# Patient Record
Sex: Female | Born: 1935 | Race: White | Hispanic: No | Marital: Single | Smoking: Never smoker
Health system: Southern US, Community
[De-identification: ages and names within clinical notes are randomized; demographics above are authoritative.]

## PROBLEM LIST (undated history)

## (undated) DIAGNOSIS — D72829 Elevated white blood cell count, unspecified: Secondary | ICD-10-CM

## (undated) DIAGNOSIS — M545 Other chronic pain: Secondary | ICD-10-CM

## (undated) DIAGNOSIS — Z86010 Personal history of colonic polyps: Secondary | ICD-10-CM

## (undated) DIAGNOSIS — G4700 Insomnia, unspecified: Principal | ICD-10-CM

## (undated) DIAGNOSIS — IMO0002 Reserved for concepts with insufficient information to code with codable children: Secondary | ICD-10-CM

## (undated) DIAGNOSIS — Z860101 Personal history of adenomatous and serrated colon polyps: Secondary | ICD-10-CM

## (undated) DIAGNOSIS — C4490 Unspecified malignant neoplasm of skin, unspecified: Secondary | ICD-10-CM

## (undated) DIAGNOSIS — G3184 Mild cognitive impairment, so stated: Secondary | ICD-10-CM

## (undated) DIAGNOSIS — M51369 Other intervertebral disc degeneration, lumbar region without mention of lumbar back pain or lower extremity pain: Secondary | ICD-10-CM

## (undated) DIAGNOSIS — M5136 Other intervertebral disc degeneration, lumbar region: Secondary | ICD-10-CM

## (undated) DIAGNOSIS — Z9289 Personal history of other medical treatment: Secondary | ICD-10-CM

## (undated) DIAGNOSIS — R011 Cardiac murmur, unspecified: Secondary | ICD-10-CM

## (undated) DIAGNOSIS — C9110 Chronic lymphocytic leukemia of B-cell type not having achieved remission: Secondary | ICD-10-CM

## (undated) DIAGNOSIS — E785 Hyperlipidemia, unspecified: Secondary | ICD-10-CM

## (undated) DIAGNOSIS — G8929 Other chronic pain: Secondary | ICD-10-CM

## (undated) HISTORY — DX: Personal history of other medical treatment: Z92.89

## (undated) HISTORY — PX: CATARACT EXTRACTION: SUR2

## (undated) HISTORY — DX: Cardiac murmur, unspecified: R01.1

## (undated) HISTORY — DX: Reserved for concepts with insufficient information to code with codable children: IMO0002

## (undated) HISTORY — DX: Personal history of colonic polyps: Z86.010

## (undated) HISTORY — DX: Other chronic pain: M54.50

## (undated) HISTORY — PX: BREAST ENHANCEMENT SURGERY: SHX7

## (undated) HISTORY — DX: Other chronic pain: G89.29

## (undated) HISTORY — DX: Personal history of adenomatous and serrated colon polyps: Z86.0101

## (undated) HISTORY — DX: Unspecified malignant neoplasm of skin, unspecified: C44.90

## (undated) HISTORY — DX: Chronic lymphocytic leukemia of B-cell type not having achieved remission: C91.10

## (undated) HISTORY — DX: Mild cognitive impairment, so stated: G31.84

## (undated) HISTORY — DX: Elevated white blood cell count, unspecified: D72.829

## (undated) HISTORY — DX: Other intervertebral disc degeneration, lumbar region without mention of lumbar back pain or lower extremity pain: M51.369

## (undated) HISTORY — DX: Insomnia, unspecified: G47.00

## (undated) HISTORY — DX: Other intervertebral disc degeneration, lumbar region: M51.36

## (undated) HISTORY — DX: Low back pain: M54.5

## (undated) HISTORY — DX: Hyperlipidemia, unspecified: E78.5

---

## 1994-09-16 HISTORY — PX: KNEE ARTHROSCOPY: SUR90

## 1995-09-17 HISTORY — PX: OTHER SURGICAL HISTORY: SHX169

## 2004-01-11 ENCOUNTER — Other Ambulatory Visit: Admission: RE | Admit: 2004-01-11 | Discharge: 2004-01-11 | Payer: Self-pay | Admitting: Oncology

## 2004-09-04 ENCOUNTER — Ambulatory Visit: Payer: Self-pay | Admitting: Oncology

## 2005-10-15 ENCOUNTER — Ambulatory Visit: Payer: Self-pay | Admitting: Oncology

## 2008-08-16 ENCOUNTER — Ambulatory Visit: Payer: Self-pay | Admitting: Oncology

## 2008-08-18 LAB — CBC WITH DIFFERENTIAL/PLATELET
EOS%: 0.9 % (ref 0.0–7.0)
MCH: 32.2 pg (ref 26.0–34.0)
MCV: 94 fL (ref 81.0–101.0)
MONO%: 4.1 % (ref 0.0–13.0)
NEUT#: 3.4 10*3/uL (ref 1.5–6.5)
RBC: 4 10*6/uL (ref 3.70–5.32)
RDW: 14.5 % (ref 11.3–14.5)
lymph#: 12.1 10*3/uL — ABNORMAL HIGH (ref 0.9–3.3)

## 2008-08-18 LAB — COMPREHENSIVE METABOLIC PANEL
AST: 18 U/L (ref 0–37)
Albumin: 4.2 g/dL (ref 3.5–5.2)
Alkaline Phosphatase: 71 U/L (ref 39–117)
BUN: 16 mg/dL (ref 6–23)
Potassium: 4.4 mEq/L (ref 3.5–5.3)
Sodium: 135 mEq/L (ref 135–145)
Total Bilirubin: 0.6 mg/dL (ref 0.3–1.2)

## 2008-12-01 ENCOUNTER — Encounter: Payer: Self-pay | Admitting: Family Medicine

## 2009-01-19 ENCOUNTER — Emergency Department (HOSPITAL_BASED_OUTPATIENT_CLINIC_OR_DEPARTMENT_OTHER): Admission: EM | Admit: 2009-01-19 | Discharge: 2009-01-19 | Payer: Self-pay | Admitting: Emergency Medicine

## 2009-01-19 ENCOUNTER — Ambulatory Visit: Payer: Self-pay | Admitting: Radiology

## 2009-02-09 ENCOUNTER — Ambulatory Visit: Payer: Self-pay | Admitting: Family Medicine

## 2009-02-09 DIAGNOSIS — R030 Elevated blood-pressure reading, without diagnosis of hypertension: Secondary | ICD-10-CM

## 2009-02-09 DIAGNOSIS — M810 Age-related osteoporosis without current pathological fracture: Secondary | ICD-10-CM

## 2009-02-09 DIAGNOSIS — C9110 Chronic lymphocytic leukemia of B-cell type not having achieved remission: Secondary | ICD-10-CM

## 2009-02-09 DIAGNOSIS — M5137 Other intervertebral disc degeneration, lumbosacral region: Secondary | ICD-10-CM

## 2009-02-27 DIAGNOSIS — E559 Vitamin D deficiency, unspecified: Secondary | ICD-10-CM

## 2009-02-27 DIAGNOSIS — H409 Unspecified glaucoma: Secondary | ICD-10-CM | POA: Insufficient documentation

## 2009-02-27 DIAGNOSIS — M4120 Other idiopathic scoliosis, site unspecified: Secondary | ICD-10-CM | POA: Insufficient documentation

## 2009-03-01 ENCOUNTER — Ambulatory Visit: Payer: Self-pay | Admitting: Diagnostic Radiology

## 2009-03-01 ENCOUNTER — Ambulatory Visit (HOSPITAL_BASED_OUTPATIENT_CLINIC_OR_DEPARTMENT_OTHER)
Admission: RE | Admit: 2009-03-01 | Discharge: 2009-03-01 | Payer: Self-pay | Admitting: Physical Medicine and Rehabilitation

## 2009-03-01 ENCOUNTER — Ambulatory Visit: Payer: Self-pay | Admitting: Physical Medicine and Rehabilitation

## 2009-03-03 ENCOUNTER — Ambulatory Visit: Payer: Self-pay | Admitting: Oncology

## 2009-03-22 ENCOUNTER — Ambulatory Visit: Payer: Self-pay | Admitting: Physical Medicine and Rehabilitation

## 2009-03-22 LAB — CBC WITH DIFFERENTIAL/PLATELET
Basophils Absolute: 0.1 10*3/uL (ref 0.0–0.1)
EOS%: 0.8 % (ref 0.0–7.0)
HCT: 37 % (ref 34.8–46.6)
HGB: 12.5 g/dL (ref 11.6–15.9)
MCH: 31.3 pg (ref 25.1–34.0)
MONO#: 0.7 10*3/uL (ref 0.1–0.9)
NEUT#: 3.8 10*3/uL (ref 1.5–6.5)
NEUT%: 25.1 % — ABNORMAL LOW (ref 38.4–76.8)
RDW: 14.1 % (ref 11.2–14.5)
WBC: 15.1 10*3/uL — ABNORMAL HIGH (ref 3.9–10.3)
lymph#: 10.4 10*3/uL — ABNORMAL HIGH (ref 0.9–3.3)

## 2009-03-22 LAB — COMPREHENSIVE METABOLIC PANEL
AST: 22 U/L (ref 0–37)
Albumin: 3.9 g/dL (ref 3.5–5.2)
Alkaline Phosphatase: 47 U/L (ref 39–117)
BUN: 18 mg/dL (ref 6–23)
Calcium: 8.8 mg/dL (ref 8.4–10.5)
Chloride: 103 mEq/L (ref 96–112)
Glucose, Bld: 134 mg/dL — ABNORMAL HIGH (ref 70–99)
Potassium: 4.3 mEq/L (ref 3.5–5.3)
Sodium: 137 mEq/L (ref 135–145)
Total Protein: 6.1 g/dL (ref 6.0–8.3)

## 2009-05-03 ENCOUNTER — Ambulatory Visit: Payer: Self-pay | Admitting: Physical Medicine and Rehabilitation

## 2009-05-24 ENCOUNTER — Encounter
Admission: RE | Admit: 2009-05-24 | Discharge: 2009-08-22 | Payer: Self-pay | Admitting: Physical Medicine and Rehabilitation

## 2009-06-24 ENCOUNTER — Ambulatory Visit: Payer: Self-pay | Admitting: Diagnostic Radiology

## 2009-06-24 ENCOUNTER — Emergency Department (HOSPITAL_BASED_OUTPATIENT_CLINIC_OR_DEPARTMENT_OTHER): Admission: EM | Admit: 2009-06-24 | Discharge: 2009-06-24 | Payer: Self-pay | Admitting: Emergency Medicine

## 2009-07-05 ENCOUNTER — Ambulatory Visit: Payer: Self-pay | Admitting: Physical Medicine and Rehabilitation

## 2009-09-04 ENCOUNTER — Ambulatory Visit: Payer: Self-pay | Admitting: Oncology

## 2009-09-06 LAB — CBC WITH DIFFERENTIAL/PLATELET
EOS%: 0.2 % (ref 0.0–7.0)
MCH: 31.7 pg (ref 25.1–34.0)
MCV: 94.2 fL (ref 79.5–101.0)
MONO%: 3.8 % (ref 0.0–14.0)
RBC: 4.25 10*6/uL (ref 3.70–5.45)
RDW: 14.6 % — ABNORMAL HIGH (ref 11.2–14.5)

## 2009-09-06 LAB — COMPREHENSIVE METABOLIC PANEL
AST: 28 U/L (ref 0–37)
Albumin: 4.3 g/dL (ref 3.5–5.2)
Alkaline Phosphatase: 44 U/L (ref 39–117)
BUN: 15 mg/dL (ref 6–23)
Potassium: 4.2 mEq/L (ref 3.5–5.3)
Sodium: 137 mEq/L (ref 135–145)
Total Protein: 6.8 g/dL (ref 6.0–8.3)

## 2009-09-06 LAB — LACTATE DEHYDROGENASE: LDH: 148 U/L (ref 94–250)

## 2010-02-01 ENCOUNTER — Encounter: Admission: RE | Admit: 2010-02-01 | Discharge: 2010-02-01 | Payer: Self-pay | Admitting: Internal Medicine

## 2010-03-06 ENCOUNTER — Ambulatory Visit: Payer: Self-pay | Admitting: Oncology

## 2010-03-08 LAB — COMPREHENSIVE METABOLIC PANEL
Albumin: 4.2 g/dL (ref 3.5–5.2)
Alkaline Phosphatase: 43 U/L (ref 39–117)
BUN: 23 mg/dL (ref 6–23)
CO2: 28 mEq/L (ref 19–32)
Glucose, Bld: 80 mg/dL (ref 70–99)
Potassium: 4.4 mEq/L (ref 3.5–5.3)

## 2010-03-08 LAB — CBC WITH DIFFERENTIAL/PLATELET
Basophils Absolute: 0.1 10*3/uL (ref 0.0–0.1)
EOS%: 0.8 % (ref 0.0–7.0)
HCT: 39.3 % (ref 34.8–46.6)
HGB: 13.4 g/dL (ref 11.6–15.9)
MCH: 32.1 pg (ref 25.1–34.0)
MCV: 94.4 fL (ref 79.5–101.0)
MONO%: 4.2 % (ref 0.0–14.0)
NEUT%: 18.3 % — ABNORMAL LOW (ref 38.4–76.8)
lymph#: 11.2 10*3/uL — ABNORMAL HIGH (ref 0.9–3.3)

## 2010-09-05 ENCOUNTER — Ambulatory Visit: Payer: Self-pay | Admitting: Oncology

## 2010-09-06 LAB — COMPREHENSIVE METABOLIC PANEL
Albumin: 4.3 g/dL (ref 3.5–5.2)
BUN: 23 mg/dL (ref 6–23)
CO2: 28 mEq/L (ref 19–32)
Calcium: 8.9 mg/dL (ref 8.4–10.5)
Chloride: 102 mEq/L (ref 96–112)
Glucose, Bld: 97 mg/dL (ref 70–99)
Potassium: 4.5 mEq/L (ref 3.5–5.3)
Sodium: 135 mEq/L (ref 135–145)
Total Protein: 6.6 g/dL (ref 6.0–8.3)

## 2010-09-06 LAB — CBC WITH DIFFERENTIAL/PLATELET
Basophils Absolute: 0.1 10*3/uL (ref 0.0–0.1)
Eosinophils Absolute: 0.1 10*3/uL (ref 0.0–0.5)
HGB: 12.7 g/dL (ref 11.6–15.9)
MCV: 93.6 fL (ref 79.5–101.0)
MONO#: 0.6 10*3/uL (ref 0.1–0.9)
MONO%: 3.4 % (ref 0.0–14.0)
NEUT#: 4.4 10*3/uL (ref 1.5–6.5)
RDW: 14.4 % (ref 11.2–14.5)
WBC: 16.5 10*3/uL — ABNORMAL HIGH (ref 3.9–10.3)
lymph#: 11.4 10*3/uL — ABNORMAL HIGH (ref 0.9–3.3)

## 2010-10-07 ENCOUNTER — Encounter: Payer: Self-pay | Admitting: Obstetrics and Gynecology

## 2010-12-20 LAB — CBC
HCT: 42.7 % (ref 36.0–46.0)
Hemoglobin: 14.3 g/dL (ref 12.0–15.0)
MCV: 92.2 fL (ref 78.0–100.0)
Platelets: 279 10*3/uL (ref 150–400)
RDW: 13.8 % (ref 11.5–15.5)
WBC: 16.3 10*3/uL — ABNORMAL HIGH (ref 4.0–10.5)

## 2010-12-20 LAB — DIFFERENTIAL
Basophils Absolute: 0.4 10*3/uL — ABNORMAL HIGH (ref 0.0–0.1)
Basophils Relative: 2 % — ABNORMAL HIGH (ref 0–1)
Lymphocytes Relative: 65 % — ABNORMAL HIGH (ref 12–46)
Monocytes Absolute: 0.4 10*3/uL (ref 0.1–1.0)
Neutro Abs: 5 10*3/uL (ref 1.7–7.7)
Neutrophils Relative %: 30 % — ABNORMAL LOW (ref 43–77)

## 2010-12-20 LAB — COMPREHENSIVE METABOLIC PANEL
Albumin: 4.6 g/dL (ref 3.5–5.2)
Alkaline Phosphatase: 68 U/L (ref 39–117)
BUN: 13 mg/dL (ref 6–23)
Chloride: 102 mEq/L (ref 96–112)
Creatinine, Ser: 0.7 mg/dL (ref 0.4–1.2)
Glucose, Bld: 132 mg/dL — ABNORMAL HIGH (ref 70–99)
Potassium: 4.1 mEq/L (ref 3.5–5.1)
Total Bilirubin: 1.1 mg/dL (ref 0.3–1.2)

## 2010-12-20 LAB — URINALYSIS, ROUTINE W REFLEX MICROSCOPIC
Ketones, ur: 40 mg/dL — AB
Protein, ur: NEGATIVE mg/dL
Urobilinogen, UA: 1 mg/dL (ref 0.0–1.0)

## 2010-12-20 LAB — URINE CULTURE: Colony Count: 80000

## 2010-12-20 LAB — URINE MICROSCOPIC-ADD ON

## 2010-12-20 LAB — LIPASE, BLOOD: Lipase: 40 U/L (ref 23–300)

## 2010-12-25 LAB — BASIC METABOLIC PANEL
BUN: 18 mg/dL (ref 6–23)
CO2: 26 mEq/L (ref 19–32)
Calcium: 8.4 mg/dL (ref 8.4–10.5)
Chloride: 100 mEq/L (ref 96–112)
Creatinine, Ser: 0.7 mg/dL (ref 0.4–1.2)
Glucose, Bld: 102 mg/dL — ABNORMAL HIGH (ref 70–99)

## 2010-12-25 LAB — DIFFERENTIAL
Basophils Absolute: 0.2 10*3/uL — ABNORMAL HIGH (ref 0.0–0.1)
Eosinophils Absolute: 0.2 10*3/uL (ref 0.0–0.7)
Lymphocytes Relative: 61 % — ABNORMAL HIGH (ref 12–46)
Lymphs Abs: 9.9 10*3/uL — ABNORMAL HIGH (ref 0.7–4.0)
Neutro Abs: 5.3 10*3/uL (ref 1.7–7.7)

## 2010-12-25 LAB — D-DIMER, QUANTITATIVE: D-Dimer, Quant: <0.22 ug/mL-FEU (ref 0.00–0.48)

## 2010-12-25 LAB — CBC
HCT: 37.5 % (ref 36.0–46.0)
MCV: 93.5 fL (ref 78.0–100.0)
Platelets: 226 10*3/uL (ref 150–400)
RDW: 13.6 % (ref 11.5–15.5)

## 2010-12-25 LAB — POCT CARDIAC MARKERS

## 2011-01-01 ENCOUNTER — Ambulatory Visit (INDEPENDENT_AMBULATORY_CARE_PROVIDER_SITE_OTHER): Payer: Medicare Other | Admitting: Internal Medicine

## 2011-01-01 ENCOUNTER — Encounter: Payer: Self-pay | Admitting: Internal Medicine

## 2011-01-01 VITALS — BP 112/64 | HR 68 | Ht 62.0 in | Wt 130.0 lb

## 2011-01-01 DIAGNOSIS — Z800 Family history of malignant neoplasm of digestive organs: Secondary | ICD-10-CM

## 2011-01-01 DIAGNOSIS — K589 Irritable bowel syndrome without diarrhea: Secondary | ICD-10-CM

## 2011-01-01 DIAGNOSIS — K648 Other hemorrhoids: Secondary | ICD-10-CM

## 2011-01-01 MED ORDER — PEG-KCL-NACL-NASULF-NA ASC-C 100 G PO SOLR: 1.0000 | Freq: Once | ORAL | Status: AC

## 2011-01-01 NOTE — Progress Notes (Signed)
Alison Hammond [birth year] MRN [id number]    History of Present Illness:  This is a 77 year old white female who is here to discuss having a colonoscopy. We have received records from [facility] where she has yearly physical exams for the past 5 years. Her last colonoscopy in January 2008 did not show any evidence of polyps. She had prior colonoscopies in [place] and [employer]. She apparently had polyps but we do not have those records. There is a positive family history of colon cancer in patient's mother who died at the age of 62 due to the colon cancer. Her father had esophageal cancer. Patient has chronic lymphocytic leukemia followed by Dr. Arline Asp. She had a normal upper endoscopy in June 2009 . Her only GI complaint is  bloating and gas as well as problems with hemorrhoids. She has osteoporosis and severe degenerative joint disease with thoracolumbar scoliosis. She is interested in a colorectal screening on a regular basis despite her age of 72.   Past Medical History  Diagnosis Date  . Osteoporosis   . Chronic low back pain   . DDD (degenerative disc disease), lumbar   . Chronic lymphocytic leukemia   . Hx of adenomatous colonic polyps     no polyps in 1/08  . Hyperlipidemia   . Glaucoma    Past Surgical History  Procedure Date  . Breast enhancement surgery 1978, 1980    with silicone  . Knee arthroscopy 1996    right  . Repair of crushed femur 1997  . Cataract extraction     right eye    reports that she has never smoked. She has never used smokeless tobacco. She reports that she drinks alcohol. She reports that she does not use illicit drugs. family history includes Colon cancer in her mother; Esophageal cancer in her father; and Fibromyalgia in her daughter. No Known Allergies      Review of Systems: Negative for chest pains, acid symptoms, belching, loss of appetite, nausea or vomiting. Positive for bloating and flatulence and mild hemorrhoids. Denies rectal  bleeding, jaundice or liver problems.  The remainder of the 10  point ROS is negative except as outlined in H&P   Physical Exam: General appearance  Well developed, in no distress. Eyes- non icteric. HEENT nontraumatic, normocephalic. Mouth no lesions, tongue papillated, no cheilosis. Neck supple without adenopathy, thyroid not enlarged, no carotid bruits, no JVD. Lungs Clear to auscultation bilaterally. Cor normal S1 normal S2, regular rhythm , no murmur,  quiet precordium. Abdomen soft, nontender without bruit or tenderness. Liver edge at costal margin. Lower abdomen unremarkable. Rectal: Not done. Extremities: trace  pedal edema, her left leg is shorter by 2 inches due to a prior fracture of the left femoral bone. Skin no lesions. Neurological alert and oriented x 3. Psychological normal mood and affect. Assessment and Plan:  Problems #1 colorectal screening. Patient has a family history of colon cancer and a personal history of colon polyps. Her last colonoscopy was 4 years ago. Patient is interested in proceeding with a colorectal screening. We will schedule her for a colonoscopy. We have discussed the indications and preparation for conscious sedation.  Problem #2 internal hemorrhoids. Patient is currently asymptomatic.  Problem #3 chronic lymphocytic leukemia. This is stable and is followed by Dr Arline Asp.   01/01/2011 Alison Hammond

## 2011-01-01 NOTE — Patient Instructions (Addendum)
You have been scheduled for a colonoscopy. Please follow written instructions given to you at your visit today.  Please pick up your Moviprep kit at the pharmacy within the next 2-3 days. CC: Dr Arline Asp

## 2011-01-03 ENCOUNTER — Encounter: Payer: Self-pay | Admitting: Internal Medicine

## 2011-01-22 ENCOUNTER — Other Ambulatory Visit: Payer: Medicare Other | Admitting: Internal Medicine
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
## 2011-01-29 NOTE — Assessment & Plan Note (Signed)
Alison Hammond is a pleasant and very active 77 year old female, who  I am seeing for the third time.  Her initial visit was on March 01, 2009.  She is referred by Dr. Cathey Endow.  At the last visit on March 22, 2009, her  MRI was reviewed with her and she was initiated on a therapeutic trial  of Neurontin.  She awaited until she came back from her beach vacation  to start the dose.  She has been on Neurontin four times a day, now for  about 2 days.  She states that overall she is better, and assessing her  pain inventory, I note that his general activity initially was  interfered by her pain a lot.  Currently, she indicates that her general  activity is interfered by pain, now very little.   Her pain continues to be in the lateral hip region, more so on the left  than on the right.  Decrease in the low back pain, she had  experience  earlier.  She reports good sleep, pain typically is worse with walking,  bending, and standing.  Improves with rest, heat,  medication, therapy,  and pacing her activities.  She gets fair relief with current  medications at this time.   Functional status, she can walk a mile, she is climbing stairs, and is  able to drive.  She is independent with self-care.  She is engaged in  the therapeutic exercise program as well.  Denies problems with bowel or  bladder.  Denies depression, anxiety, or suicidal ideation.   Review of systems is otherwise negative.   Past medical, social, and family history unchanged from previous visit.   Medications from this clinic include gabapentin 100 mg up to 4 times a  day, typically she takes it 3 times today and may take in an additional  pill at 2 a.m. if she is having some difficulty with sleep at night.  She is also taking tramadol, this in the past, that has been prescribed  by her primary care doctor, and she is requesting that, I filled it  today as well for her.   Exam, blood pressure is 123/78, pulse 78, respirations  18-20, and O2 sat  is 97%.  She is a well-developed, well-nourished elderly female, who  appears younger than her stated age.  She is oriented x3.  Her speech is  clear.  Her affect is bright.  She is alert, cooperative, and pleasant.  Follows commands without difficulty and answers my questions  appropriately.   Cranial nerves are grossly intact.  Coordination is intact.  Reflexes  are 2+ at biceps, triceps, and brachioradialis; 2+ at patellar and  diminished at the Achilles tendon.  No abnormal tone is noted.  No  clonus is noted.  No tremors are noted.  Normal muscle bulk is noted as  well.   Sensory exam is intact to light touch, pinprick in upper and lower  extremities.  Motor strength is 5/5 at hip flexors, knee extensors,  dorsiflexors, and plantar flexors.   She is able to transition easily from sitting to standing without  pushing up with her upper extremities.  Her gait is slightly uneven due  to leg length discrepancy.  She does wear shoe with a platform of one  and three-quarter inch.   Tandem gait and Romberg test are performed adequately.   Lumbar motion is limited in all planes.  She has significant scoliosis,  minimal tenderness with palpation in the paraspinal  musculature, well-  preserved range of motion noted at both hips and knees.   IMPRESSION:  1. History of leg length discrepancy, status post femur fracture in      November 1997 with one and three-quarter inch platform shoe on the      left.  2. History of scoliosis.  3. Osteoporosis.  4. Lumbar spinal stenosis with suggestion of neurogenic claudication.   PLAN:  We will refill gabapentin 100 mg up to 4 times a day, tramadol 50  mg q.i.d. #120 for both with one refill.  I will see her back on  May 26, 2009.  I have answered all her questions today.  She is  tolerating the medication well.  Notes an overall improvement in her  functional status and she is overall pleased with current pain   management.  We will see her back in less than a month.           ______________________________  Brantley Stage, M.D.     DMK/MedQ  D:  05/03/2009 12:32:22  T:  05/04/2009 03:14:39  Job #:  [id number]   cc:   Seymour Bars, D.O.  [facility].  [address]  [place], [place] [address]

## 2011-01-29 NOTE — Assessment & Plan Note (Signed)
[name] is a very active 77 year old female who I am seeing for  the second time.  Her initial visit was on March 01, 2009.   She is also accompanied by her daughter, Alison Hammond.  She was  kindly referred by Dr. Cathey Endow.   Chief complaint was chronic low back pain which she has had for many  years.  She has known scoliosis and a leg length discrepancy which she  acquired after sustaining a severe left femur fracture.   She does wear a one and three-quarter inch heal lift on her left shoe.   Her chief complaint is low back pain which is localized mainly in the  low back but radiates through the posterior bilateral buttocks into the  posterior thigh.  This occurs typically when she is up walking or  standing for more than 1/2 hour at a time.  She is really essentially  pain free when she sits or when she sleeps at night.  Most of her  problem occurs when she has been up standing or walking for any length  of time.   Radiographs were ordered at the last visit and are reviewed here today  with her.  Left hip radiograph showed satisfactory position of the left  hip hardware.  Of note, there was some heterotopic ossification noted.  There is no acute fracture.  Lumbar spine radiographs were also  reviewed, showed old L1 compression fracture, multilevel degenerative  disk disease, rotoscoliotic deformity, convex left grade 1  anterolisthesis of L4 and L5.  MRI of the lumbar spine also done on March 01, 2009, showed old compression fracture at L1 which is completely  healed, lateral recess narrowing at L2-3 and L3-4 without obvious neural  compression, facet arthropathy at L3-4 was noted, severe multifactorial  stenosis at L4-5 secondary to facet arthropathy, ligamentous hypertrophy  anterolisthesis of 5 mm, and bulging of the disk.  Suggestion that nerve  compression may seem likely at this level as well.   The results of these studies were reviewed with her today, and I  answered the questions regarding them.   Her average pain is about 7 or 8 after she has been walking 30+ minutes.  She is able to climb stairs and drive.  She is independent with self-  care.  Overall, high functioning individual, also engaged in exercise  program at her extended living center.   Denies problems controlling bowel or bladder.  Denies depression or  anxiety.  Denies suicidal ideation.  Denies problems with balance.   REVIEW OF SYSTEMS:  Otherwise, negative.   No changes in past medical, social or family history since last visit.   PHYSICAL EXAMINATION:  Blood pressure is 126/77, pulse 72, respirations  20, 99% saturated on room air.  She is a thin adult female who does not  appear in any distress.  She is oriented x3.  Speech is clear.  Affect  is bright.  She is alert, cooperative, and pleasant.  Follows commands  without difficulty.  Answers my questions appropriately.   Cranial nerves are grossly intact.  Coordination is intact.  Reflexes  are 2+ in the biceps, triceps, brachioradialis, 2+ at the patellar and  Achilles tendons.  No abnormal tone is noted.  No clonus is noted.  No  tremors are appreciated.  Sensory exam is intact to light touch in the  lower extremities.  Motor strength 5/5 is noted at hip flexors, knee  extensors, dorsiflexors, plantar flexors, EHL.  The patient transitions easily from sitting to standing.  Gait in the  room is not antalgic.  It is a little uneven.  She does have a 1-1/2-  inch platform shoe on the left.  She is able to perform tandem gait and  Romberg test without difficulty.   IMPRESSION:  1. Severe multifactorial stenosis at L4-5 secondary to facet      arthropathy, ligamentous hypertrophy, and anterolisthesis of L4-5      of 5 mm.  2. Lumbago which occurs with prolonged walking and standing.  3. Intermittent posterior lower extremity discomfort, pain consistent      with neurogenic claudication-type symptoms.  4. History  of scoliosis.  5. History of osteoporosis.  6. Status post left hip fracture status post intramedullary nailing      remotely.  7. Leg length discrepancy.   PLAN:  Radiographs and MRI were reviewed with the patient.  I answered  all their questions regarding these studies.   Treatment options were discussed today.  These treatment options  included doing nothing and continuing tramadol.  Also, suggested  consider adding Neurontin to see if we can improve the amount of time  she can stand up walk.  Benefits and risks of Neurontin were discussed  with her and her daughter.   Also discussed more invasive means of managing low back pain to include  epidural steroid injection as well as medial branch blocks and possible  surgical referral.   At this point, they would like to try Neurontin and will hold off on  more invasive means of management for now since she is quite active.   We will start Neurontin 100 mg at night, titrating up to 4 times a day.  Risks and benefits of this medication were reviewed with her which  include sedation, gait instability, dizziness, dry mouth.  She would  like to trial the medication, understands that there are some side  effects that she may experience.  I have told her she may discontinue  the medication that these side effects are problematic for her.  We will  see her back in a month.           ______________________________  Brantley Stage, M.D.     DMK/MedQ  D:  03/22/2009 13:40:11  T:  03/23/2009 02:26:03  Job #:  [id number]   cc:   Seymour Bars, D.O.  [facility].  [address], [address]  [place], [place] [address]

## 2011-02-19 ENCOUNTER — Ambulatory Visit (AMBULATORY_SURGERY_CENTER): Payer: Medicare Other | Admitting: Internal Medicine

## 2011-02-19 ENCOUNTER — Encounter: Payer: Self-pay | Admitting: Internal Medicine

## 2011-02-19 DIAGNOSIS — Z800 Family history of malignant neoplasm of digestive organs: Secondary | ICD-10-CM

## 2011-02-19 DIAGNOSIS — Z86010 Personal history of colonic polyps: Secondary | ICD-10-CM

## 2011-02-19 DIAGNOSIS — K5730 Diverticulosis of large intestine without perforation or abscess without bleeding: Secondary | ICD-10-CM

## 2011-02-19 DIAGNOSIS — Z1211 Encounter for screening for malignant neoplasm of colon: Secondary | ICD-10-CM

## 2011-02-19 MED ORDER — SODIUM CHLORIDE 0.9 % IV SOLN: 500.0000 mL | INTRAVENOUS | Status: DC

## 2011-02-19 NOTE — Patient Instructions (Signed)
Discharge instructions given with verbal understanding. Handout on diverticulosis, high fiber diet and hemorrhoids given. Resume previous medications.

## 2011-02-20 ENCOUNTER — Telehealth: Payer: Self-pay | Admitting: *Deleted

## 2011-02-20 NOTE — Telephone Encounter (Signed)

## 2011-03-01 ENCOUNTER — Other Ambulatory Visit (HOSPITAL_COMMUNITY): Payer: Self-pay | Admitting: Oncology

## 2011-03-01 ENCOUNTER — Encounter (HOSPITAL_BASED_OUTPATIENT_CLINIC_OR_DEPARTMENT_OTHER): Payer: Medicare Other | Admitting: Oncology

## 2011-03-01 DIAGNOSIS — C9110 Chronic lymphocytic leukemia of B-cell type not having achieved remission: Secondary | ICD-10-CM

## 2011-03-01 LAB — CBC WITH DIFFERENTIAL/PLATELET
BASO%: 0.2 % (ref 0.0–2.0)
EOS%: 0.8 % (ref 0.0–7.0)
HCT: 36.4 % (ref 34.8–46.6)
LYMPH%: 72.9 % — ABNORMAL HIGH (ref 14.0–49.7)
MCH: 29.9 pg (ref 25.1–34.0)
MCHC: 33.5 g/dL (ref 31.5–36.0)
NEUT%: 20.2 % — ABNORMAL LOW (ref 38.4–76.8)
Platelets: 240 10*3/uL (ref 145–400)
lymph#: 11.1 10*3/uL — ABNORMAL HIGH (ref 0.9–3.3)

## 2011-03-28 ENCOUNTER — Encounter: Payer: Medicare Other | Admitting: Oncology

## 2011-05-23 ENCOUNTER — Ambulatory Visit: Payer: Medicare Other | Admitting: Gynecologic Oncology

## 2011-08-28 ENCOUNTER — Telehealth: Payer: Self-pay | Admitting: Oncology

## 2011-08-28 NOTE — Telephone Encounter (Signed)
S/w pt, gave appt 11/07/11 @ 10.30am.

## 2011-09-17 HISTORY — PX: ABDOMINAL HYSTERECTOMY: SHX81

## 2011-09-20 DIAGNOSIS — M545 Low back pain: Secondary | ICD-10-CM | POA: Diagnosis not present

## 2011-09-20 DIAGNOSIS — M6281 Muscle weakness (generalized): Secondary | ICD-10-CM | POA: Diagnosis not present

## 2011-09-20 DIAGNOSIS — IMO0001 Reserved for inherently not codable concepts without codable children: Secondary | ICD-10-CM | POA: Diagnosis not present

## 2011-09-20 DIAGNOSIS — R262 Difficulty in walking, not elsewhere classified: Secondary | ICD-10-CM | POA: Diagnosis not present

## 2011-09-23 DIAGNOSIS — M6281 Muscle weakness (generalized): Secondary | ICD-10-CM | POA: Diagnosis not present

## 2011-09-23 DIAGNOSIS — M76899 Other specified enthesopathies of unspecified lower limb, excluding foot: Secondary | ICD-10-CM | POA: Diagnosis not present

## 2011-09-23 DIAGNOSIS — M545 Low back pain, unspecified: Secondary | ICD-10-CM | POA: Diagnosis not present

## 2011-09-23 DIAGNOSIS — R262 Difficulty in walking, not elsewhere classified: Secondary | ICD-10-CM | POA: Diagnosis not present

## 2011-09-23 DIAGNOSIS — IMO0001 Reserved for inherently not codable concepts without codable children: Secondary | ICD-10-CM | POA: Diagnosis not present

## 2011-09-26 DIAGNOSIS — IMO0001 Reserved for inherently not codable concepts without codable children: Secondary | ICD-10-CM | POA: Diagnosis not present

## 2011-09-26 DIAGNOSIS — R262 Difficulty in walking, not elsewhere classified: Secondary | ICD-10-CM | POA: Diagnosis not present

## 2011-09-26 DIAGNOSIS — M545 Low back pain: Secondary | ICD-10-CM | POA: Diagnosis not present

## 2011-09-26 DIAGNOSIS — M6281 Muscle weakness (generalized): Secondary | ICD-10-CM | POA: Diagnosis not present

## 2011-10-01 DIAGNOSIS — M545 Low back pain: Secondary | ICD-10-CM | POA: Diagnosis not present

## 2011-10-01 DIAGNOSIS — IMO0001 Reserved for inherently not codable concepts without codable children: Secondary | ICD-10-CM | POA: Diagnosis not present

## 2011-10-01 DIAGNOSIS — R262 Difficulty in walking, not elsewhere classified: Secondary | ICD-10-CM | POA: Diagnosis not present

## 2011-10-01 DIAGNOSIS — M6281 Muscle weakness (generalized): Secondary | ICD-10-CM | POA: Diagnosis not present

## 2011-10-03 DIAGNOSIS — M6281 Muscle weakness (generalized): Secondary | ICD-10-CM | POA: Diagnosis not present

## 2011-10-03 DIAGNOSIS — IMO0001 Reserved for inherently not codable concepts without codable children: Secondary | ICD-10-CM | POA: Diagnosis not present

## 2011-10-03 DIAGNOSIS — M545 Low back pain: Secondary | ICD-10-CM | POA: Diagnosis not present

## 2011-10-03 DIAGNOSIS — R262 Difficulty in walking, not elsewhere classified: Secondary | ICD-10-CM | POA: Diagnosis not present

## 2011-11-07 ENCOUNTER — Other Ambulatory Visit: Payer: Medicare Other | Admitting: Lab

## 2011-11-07 ENCOUNTER — Telehealth: Payer: Self-pay | Admitting: Oncology

## 2011-11-07 ENCOUNTER — Encounter: Payer: Self-pay | Admitting: Oncology

## 2011-11-07 ENCOUNTER — Ambulatory Visit (HOSPITAL_BASED_OUTPATIENT_CLINIC_OR_DEPARTMENT_OTHER): Payer: Medicare Other | Admitting: Oncology

## 2011-11-07 VITALS — BP 161/83 | HR 78 | Temp 96.6°F | Ht 62.0 in | Wt 129.7 lb

## 2011-11-07 DIAGNOSIS — C9110 Chronic lymphocytic leukemia of B-cell type not having achieved remission: Secondary | ICD-10-CM

## 2011-11-07 LAB — CBC WITH DIFFERENTIAL/PLATELET
Basophils Absolute: 0.1 10*3/uL (ref 0.0–0.1)
Eosinophils Absolute: 0.1 10*3/uL (ref 0.0–0.5)
HCT: 39.6 % (ref 34.8–46.6)
HGB: 13.3 g/dL (ref 11.6–15.9)
LYMPH%: 64.4 % — ABNORMAL HIGH (ref 14.0–49.7)
MCV: 93 fL (ref 79.5–101.0)
MONO#: 0.7 10*3/uL (ref 0.1–0.9)
MONO%: 4.5 % (ref 0.0–14.0)
NEUT#: 4.6 10*3/uL (ref 1.5–6.5)
NEUT%: 30.1 % — ABNORMAL LOW (ref 38.4–76.8)
Platelets: 244 10*3/uL (ref 145–400)
RBC: 4.26 10*6/uL (ref 3.70–5.45)
WBC: 15.4 10*3/uL — ABNORMAL HIGH (ref 3.9–10.3)

## 2011-11-07 LAB — COMPREHENSIVE METABOLIC PANEL
ALT: 17 U/L (ref 0–35)
BUN: 25 mg/dL — ABNORMAL HIGH (ref 6–23)
CO2: 28 mEq/L (ref 19–32)
Calcium: 9.4 mg/dL (ref 8.4–10.5)
Chloride: 100 mEq/L (ref 96–112)
Creatinine, Ser: 0.89 mg/dL (ref 0.50–1.10)
Glucose, Bld: 92 mg/dL (ref 70–99)
Total Bilirubin: 0.6 mg/dL (ref 0.3–1.2)

## 2011-11-07 NOTE — Progress Notes (Signed)
PROBLEM LIST: 1. Chronic lymphocytic leukemia, stage 0 with diagnostic flow     cytometry on the peripheral blood on 01/10/2004. 2. History of colonic adenomatous polyps.  Last colonoscopy was     carried out during the summer of 2012. 3. Irritable bowel syndrome. 4. Mitral valve prolapse. 5. GERD. 6. Osteoporosis. 7. History of left femoral fracture requiring rod insertion in 1997. 8. Glaucoma involving the right eye.  MEDICATIONS: 1. Aspirin 81 mg daily. 2. Calcium carbonate-vitamin D 1 daily. 3. Boniva 150 mg every 30 days. 4. Xalatan 0.005% ophthalmic solution. 5. Anaprox 220 mg twice daily. 6. Omega-3 fatty acid 1000 mg twice daily. 7. Red yeast rice 1 capsule twice daily. 8. Ambien 6.25 mg CR tablet as needed.  Pneumovax was given in 2010. Influenza shot given in the fall of 2012.  HISTORY:  Alison Hammond is an 78 year old white female, currently residing at [facility] in [place].  The patient is followed for stage 0 chronic lymphocytic leukemia dating back to early part of 2005, i.e., 8 years ago.  The patient was last seen by [place] on September 06, 2010.  The patient's overall condition is stable.  She and her granddaughter recently returned from a 3 week trip to [place].  The patient remains extremely active and mentally sharp.  There been no health issues.  There are no symptoms related to her severe chronic lymphocytic leukemia.  PHYSICAL EXAM:  General: Alison Hammond certainly looks well, somewhat younger in appearance and demeanor than her stated age, soon to be 6. Vital signs: Weight is 129.7 pounds, height 5 feet 2 inches.  Body surface area 1.6 m squared.  Blood pressure 161/83.  Other vital signs are normal.  HEENT: There is no scleral icterus.  Mouth and pharynx are benign.  No peripheral adenopathy palpable in the neck, supraclavicular, axillary, or inguinal areas.  Heart and lungs are normal.  I did not appreciate a murmur.  Breasts were not examined.  The  patient is due for mammograms.  Her last mammograms were in January 2012.  Abdomen:  Benign with no organomegaly or masses palpable.  Extremities:  No peripheral edema or clubbing.  She has a thick sole involving her left shoe, where she had a fracture involving her left femur in 1997.  She uses a cane. Neurologic exam is nonfocal.  No skin rashes, petechiae, or purpura.  LABORATORY DATA:  Today, white count 15.4, ANC 4.6, hemoglobin 13.3, hematocrit 39.6, and platelets 244,000.  Neutrophils 30%, lymphocytes 64.4%.  Absolute lymphocyte count was 9.9.  CBC has been stable. Chemistries today are pending.  Chemistries from 09/06/2010 were normal.  IMAGING STUDIES: 1. Chest x-ray 2 views from 06/24/2009 showed emphysema without acute     cardiopulmonary process. 2. Bone density scan from January 2012 carried out at the [facility] showed a T-score of the right hip of -2.7, and right femoral     neck of -3.0.  The patient was felt to have osteoporosis by WHO     criteria.  It was noted the patient had severe scoliosis which may     be difficult to interpret certain points of the spine.  There was     evidence of a compression deformity at approximately L1 suggesting     and consistent with a diagnosis of osteoporosis.  IMPRESSION AND PLAN:  Mr. Purdie continues to do extremely well on no therapy for her stage 0 chronic lymphocytic leukemia.  She has never had any  treatment.  The patient's overall health is excellent.  She is fully active and mentally sharp.  We will plan to see her again in 1 year at which time we will check CBC and chemistries.  We will check CBC in 6 months.    ______________________________ Samul Dada, M.D. DSM/MEDQ  D:  11/07/2011  T:  11/07/2011  Job:  [id number]

## 2011-11-07 NOTE — Telephone Encounter (Signed)
gv pt appt schedule for aug 2013 and feb 2014.

## 2011-11-07 NOTE — Progress Notes (Signed)
This office note has been dictated.  #[id number]

## 2011-11-14 DIAGNOSIS — R7989 Other specified abnormal findings of blood chemistry: Secondary | ICD-10-CM | POA: Diagnosis not present

## 2011-11-14 DIAGNOSIS — Z13220 Encounter for screening for lipoid disorders: Secondary | ICD-10-CM | POA: Diagnosis not present

## 2011-11-22 DIAGNOSIS — L570 Actinic keratosis: Secondary | ICD-10-CM | POA: Diagnosis not present

## 2011-11-22 DIAGNOSIS — E7800 Pure hypercholesterolemia, unspecified: Secondary | ICD-10-CM | POA: Diagnosis not present

## 2011-12-23 DIAGNOSIS — R971 Elevated cancer antigen 125 [CA 125]: Secondary | ICD-10-CM | POA: Diagnosis not present

## 2011-12-23 DIAGNOSIS — M25559 Pain in unspecified hip: Secondary | ICD-10-CM | POA: Diagnosis not present

## 2011-12-23 DIAGNOSIS — N83209 Unspecified ovarian cyst, unspecified side: Secondary | ICD-10-CM | POA: Diagnosis not present

## 2011-12-23 DIAGNOSIS — N8300 Follicular cyst of ovary, unspecified side: Secondary | ICD-10-CM | POA: Diagnosis not present

## 2011-12-29 DIAGNOSIS — R197 Diarrhea, unspecified: Secondary | ICD-10-CM | POA: Diagnosis not present

## 2011-12-29 DIAGNOSIS — K591 Functional diarrhea: Secondary | ICD-10-CM | POA: Diagnosis not present

## 2012-01-06 DIAGNOSIS — R971 Elevated cancer antigen 125 [CA 125]: Secondary | ICD-10-CM | POA: Diagnosis not present

## 2012-01-06 DIAGNOSIS — R1900 Intra-abdominal and pelvic swelling, mass and lump, unspecified site: Secondary | ICD-10-CM | POA: Diagnosis not present

## 2012-01-06 DIAGNOSIS — IMO0002 Reserved for concepts with insufficient information to code with codable children: Secondary | ICD-10-CM | POA: Diagnosis not present

## 2012-01-06 DIAGNOSIS — Z01810 Encounter for preprocedural cardiovascular examination: Secondary | ICD-10-CM | POA: Diagnosis not present

## 2012-01-06 DIAGNOSIS — Z01818 Encounter for other preprocedural examination: Secondary | ICD-10-CM | POA: Diagnosis not present

## 2012-01-06 DIAGNOSIS — N83209 Unspecified ovarian cyst, unspecified side: Secondary | ICD-10-CM | POA: Diagnosis not present

## 2012-01-21 DIAGNOSIS — H26499 Other secondary cataract, unspecified eye: Secondary | ICD-10-CM | POA: Diagnosis not present

## 2012-01-22 ENCOUNTER — Encounter: Payer: Self-pay | Admitting: Obstetrics and Gynecology

## 2012-01-22 ENCOUNTER — Ambulatory Visit (INDEPENDENT_AMBULATORY_CARE_PROVIDER_SITE_OTHER): Payer: Medicare Other | Admitting: Obstetrics and Gynecology

## 2012-01-22 VITALS — BP 118/72 | Wt 128.0 lb

## 2012-01-22 DIAGNOSIS — R1900 Intra-abdominal and pelvic swelling, mass and lump, unspecified site: Secondary | ICD-10-CM | POA: Diagnosis not present

## 2012-01-22 NOTE — Progress Notes (Signed)
78 yo female here for my opinion on surgery scheduled 01/29/12 with Dr Nedra Hai at [facility] physical revealed increasing ovarian cyst with mild change in CA-125 ( from 24 to 49) associated with thickening of endometrium Pt was referred to Dr Nedra Hai who is recommending Laparoscopic hyst with BSO Reviewed procedure with pt and daughter. Despite age 8 pt is in excellent health with very good quality of life.  She understands the risks and I agree with the proposed surgery. Pt can continue her care here yearly or as directed by gyn-oncologist  15 minutes face-to-face visit

## 2012-01-29 DIAGNOSIS — D251 Intramural leiomyoma of uterus: Secondary | ICD-10-CM | POA: Diagnosis not present

## 2012-01-29 DIAGNOSIS — C9110 Chronic lymphocytic leukemia of B-cell type not having achieved remission: Secondary | ICD-10-CM | POA: Diagnosis not present

## 2012-01-29 DIAGNOSIS — Z7982 Long term (current) use of aspirin: Secondary | ICD-10-CM | POA: Diagnosis not present

## 2012-01-29 DIAGNOSIS — D259 Leiomyoma of uterus, unspecified: Secondary | ICD-10-CM | POA: Diagnosis not present

## 2012-01-29 DIAGNOSIS — Z79899 Other long term (current) drug therapy: Secondary | ICD-10-CM | POA: Diagnosis not present

## 2012-01-29 DIAGNOSIS — N83209 Unspecified ovarian cyst, unspecified side: Secondary | ICD-10-CM | POA: Diagnosis not present

## 2012-01-29 DIAGNOSIS — M810 Age-related osteoporosis without current pathological fracture: Secondary | ICD-10-CM | POA: Diagnosis not present

## 2012-01-29 DIAGNOSIS — I6529 Occlusion and stenosis of unspecified carotid artery: Secondary | ICD-10-CM | POA: Diagnosis not present

## 2012-01-29 DIAGNOSIS — R9389 Abnormal findings on diagnostic imaging of other specified body structures: Secondary | ICD-10-CM | POA: Diagnosis not present

## 2012-01-29 DIAGNOSIS — N840 Polyp of corpus uteri: Secondary | ICD-10-CM | POA: Diagnosis not present

## 2012-01-29 DIAGNOSIS — N859 Noninflammatory disorder of uterus, unspecified: Secondary | ICD-10-CM | POA: Diagnosis not present

## 2012-01-30 DIAGNOSIS — M810 Age-related osteoporosis without current pathological fracture: Secondary | ICD-10-CM | POA: Diagnosis not present

## 2012-01-30 DIAGNOSIS — C9110 Chronic lymphocytic leukemia of B-cell type not having achieved remission: Secondary | ICD-10-CM | POA: Diagnosis not present

## 2012-01-30 DIAGNOSIS — I6529 Occlusion and stenosis of unspecified carotid artery: Secondary | ICD-10-CM | POA: Diagnosis not present

## 2012-01-30 DIAGNOSIS — N840 Polyp of corpus uteri: Secondary | ICD-10-CM | POA: Diagnosis not present

## 2012-01-30 DIAGNOSIS — D251 Intramural leiomyoma of uterus: Secondary | ICD-10-CM | POA: Diagnosis not present

## 2012-01-30 DIAGNOSIS — N83209 Unspecified ovarian cyst, unspecified side: Secondary | ICD-10-CM | POA: Diagnosis not present

## 2012-03-09 DIAGNOSIS — N83209 Unspecified ovarian cyst, unspecified side: Secondary | ICD-10-CM | POA: Diagnosis not present

## 2012-03-09 DIAGNOSIS — N840 Polyp of corpus uteri: Secondary | ICD-10-CM | POA: Diagnosis not present

## 2012-03-27 DIAGNOSIS — L819 Disorder of pigmentation, unspecified: Secondary | ICD-10-CM | POA: Diagnosis not present

## 2012-03-27 DIAGNOSIS — L570 Actinic keratosis: Secondary | ICD-10-CM | POA: Diagnosis not present

## 2012-04-30 ENCOUNTER — Other Ambulatory Visit: Payer: Self-pay | Admitting: Obstetrics and Gynecology

## 2012-04-30 ENCOUNTER — Other Ambulatory Visit: Payer: Self-pay | Admitting: Internal Medicine

## 2012-04-30 DIAGNOSIS — Z1231 Encounter for screening mammogram for malignant neoplasm of breast: Secondary | ICD-10-CM

## 2012-05-06 ENCOUNTER — Telehealth: Payer: Self-pay | Admitting: Oncology

## 2012-05-06 NOTE — Telephone Encounter (Signed)
pt called to to r/s,called pt back and i did not get her but went ahead and r/s to 9/4     aom

## 2012-05-07 ENCOUNTER — Other Ambulatory Visit: Payer: Medicare Other | Admitting: Lab

## 2012-05-13 DIAGNOSIS — L570 Actinic keratosis: Secondary | ICD-10-CM | POA: Diagnosis not present

## 2012-05-20 ENCOUNTER — Ambulatory Visit (HOSPITAL_BASED_OUTPATIENT_CLINIC_OR_DEPARTMENT_OTHER): Payer: Medicare Other | Admitting: Lab

## 2012-05-20 DIAGNOSIS — C9110 Chronic lymphocytic leukemia of B-cell type not having achieved remission: Secondary | ICD-10-CM | POA: Diagnosis not present

## 2012-05-20 LAB — CBC WITH DIFFERENTIAL/PLATELET
BASO%: 0.2 % (ref 0.0–2.0)
Basophils Absolute: 0 10*3/uL (ref 0.0–0.1)
HCT: 43.9 % (ref 34.8–46.6)
HGB: 13.3 g/dL (ref 11.6–15.9)
MCHC: 30.3 g/dL — ABNORMAL LOW (ref 31.5–36.0)
MONO#: 0.7 10*3/uL (ref 0.1–0.9)
NEUT%: 22.4 % — ABNORMAL LOW (ref 38.4–76.8)
RDW: 15.9 % — ABNORMAL HIGH (ref 11.2–14.5)
WBC: 14.8 10*3/uL — ABNORMAL HIGH (ref 3.9–10.3)
lymph#: 10.6 10*3/uL — ABNORMAL HIGH (ref 0.9–3.3)

## 2012-06-02 ENCOUNTER — Ambulatory Visit: Payer: Medicare Other

## 2012-06-02 DIAGNOSIS — M25559 Pain in unspecified hip: Secondary | ICD-10-CM | POA: Diagnosis not present

## 2012-06-02 DIAGNOSIS — M76899 Other specified enthesopathies of unspecified lower limb, excluding foot: Secondary | ICD-10-CM | POA: Diagnosis not present

## 2012-06-03 ENCOUNTER — Ambulatory Visit: Payer: Medicare Other

## 2012-06-08 ENCOUNTER — Telehealth: Payer: Self-pay

## 2012-06-08 NOTE — Telephone Encounter (Signed)
Returned pt call asking about labs done 9/4. The labs are very similar to Feb labs. Pt has f/u appt 11/06/12 at 0830. Confirmed this with the pt. She expressed understanding

## 2012-06-17 ENCOUNTER — Encounter: Payer: Medicare Other | Admitting: Rehabilitation

## 2012-06-17 DIAGNOSIS — H15009 Unspecified scleritis, unspecified eye: Secondary | ICD-10-CM | POA: Diagnosis not present

## 2012-06-18 ENCOUNTER — Ambulatory Visit: Payer: Medicare Other | Attending: Orthopedic Surgery | Admitting: Physical Therapy

## 2012-06-18 DIAGNOSIS — R262 Difficulty in walking, not elsewhere classified: Secondary | ICD-10-CM | POA: Diagnosis not present

## 2012-06-18 DIAGNOSIS — IMO0001 Reserved for inherently not codable concepts without codable children: Secondary | ICD-10-CM | POA: Diagnosis not present

## 2012-06-18 DIAGNOSIS — M25559 Pain in unspecified hip: Secondary | ICD-10-CM | POA: Diagnosis not present

## 2012-06-22 ENCOUNTER — Ambulatory Visit: Payer: Medicare Other | Admitting: Physical Therapy

## 2012-06-23 ENCOUNTER — Ambulatory Visit: Payer: Medicare Other | Admitting: Physical Therapy

## 2012-06-29 ENCOUNTER — Ambulatory Visit: Payer: Medicare Other | Admitting: Physical Therapy

## 2012-07-02 ENCOUNTER — Ambulatory Visit: Payer: Medicare Other | Admitting: Physical Therapy

## 2012-07-06 ENCOUNTER — Ambulatory Visit: Payer: Medicare Other | Admitting: Physical Therapy

## 2012-07-09 ENCOUNTER — Ambulatory Visit: Payer: Medicare Other | Admitting: Physical Therapy

## 2012-07-13 ENCOUNTER — Ambulatory Visit: Payer: Medicare Other | Admitting: Physical Therapy

## 2012-07-16 ENCOUNTER — Ambulatory Visit: Payer: Medicare Other | Admitting: Physical Therapy

## 2012-07-20 ENCOUNTER — Ambulatory Visit: Payer: Medicare Other | Attending: Orthopedic Surgery | Admitting: Physical Therapy

## 2012-07-20 DIAGNOSIS — R262 Difficulty in walking, not elsewhere classified: Secondary | ICD-10-CM | POA: Insufficient documentation

## 2012-07-20 DIAGNOSIS — M25559 Pain in unspecified hip: Secondary | ICD-10-CM | POA: Diagnosis not present

## 2012-07-20 DIAGNOSIS — IMO0001 Reserved for inherently not codable concepts without codable children: Secondary | ICD-10-CM | POA: Insufficient documentation

## 2012-07-23 ENCOUNTER — Ambulatory Visit: Payer: Medicare Other | Admitting: Physical Therapy

## 2012-07-27 ENCOUNTER — Ambulatory Visit: Payer: Medicare Other | Admitting: Physical Therapy

## 2012-07-30 ENCOUNTER — Ambulatory Visit: Payer: Medicare Other | Admitting: Physical Therapy

## 2012-07-31 ENCOUNTER — Encounter: Payer: Self-pay | Admitting: Family

## 2012-07-31 ENCOUNTER — Ambulatory Visit (INDEPENDENT_AMBULATORY_CARE_PROVIDER_SITE_OTHER): Payer: Medicare Other | Admitting: Family

## 2012-07-31 VITALS — BP 130/78 | HR 77 | Temp 97.6°F | Resp 16 | Ht 62.0 in | Wt 127.0 lb

## 2012-07-31 DIAGNOSIS — M810 Age-related osteoporosis without current pathological fracture: Secondary | ICD-10-CM

## 2012-07-31 DIAGNOSIS — M5137 Other intervertebral disc degeneration, lumbosacral region: Secondary | ICD-10-CM | POA: Diagnosis not present

## 2012-07-31 DIAGNOSIS — J329 Chronic sinusitis, unspecified: Secondary | ICD-10-CM | POA: Insufficient documentation

## 2012-07-31 DIAGNOSIS — C9110 Chronic lymphocytic leukemia of B-cell type not having achieved remission: Secondary | ICD-10-CM | POA: Diagnosis not present

## 2012-07-31 DIAGNOSIS — E559 Vitamin D deficiency, unspecified: Secondary | ICD-10-CM

## 2012-07-31 DIAGNOSIS — R030 Elevated blood-pressure reading, without diagnosis of hypertension: Secondary | ICD-10-CM | POA: Diagnosis not present

## 2012-07-31 DIAGNOSIS — H409 Unspecified glaucoma: Secondary | ICD-10-CM

## 2012-07-31 MED ORDER — AMOXICILLIN-POT CLAVULANATE 875-125 MG PO TABS: 1.0000 | ORAL_TABLET | Freq: Two times a day (BID) | ORAL | Status: DC

## 2012-07-31 NOTE — Progress Notes (Signed)
Subjective:    Patient ID: Alison Hammond, female    DOB: 06-Mar-1925, 78 y.o.   MRN: [id number]  HPI  Pt new to establish care. She lives at [facility].   Hx heart murmur- requesting EKG.    CLL- Reports that this was diagnosed 20 years ago. She follows with Dr. Arline Hammond.  Sees him twice a year.  Reports that this is stable.   Hx of elevated blood pressure-  Not excercising.    Osteoporosis- reports that that she has not taken boniva in some time.  She was on for 1-1.5.    Vitamin D deficiency- she takes a calcium and vitamin D supplement  Glaucoma-  She is followed by Dr. Lillia Hammond- opthalmology in [place].  Hx Colon polyps-  last colo was 2012- reports clear.    DDD spine-  Reports scoliosis/spinal stenosis and hx of verterbral fracture from fall.  She is undergoing PT Working on strength and ability to get up an down.  Reports that she has daily pain, but "tries to ignore it."    Review of Systems  Constitutional: Negative for unexpected weight change.  HENT: Negative for ear pain.   Eyes: Negative for visual disturbance.  Respiratory: Negative for shortness of breath.        Reports yellow nasal discharge x 6 weeks.   Cardiovascular: Negative for chest pain and leg swelling.  Gastrointestinal: Negative for vomiting and constipation.  Genitourinary: Negative for urgency and frequency.  Musculoskeletal: Positive for back pain.  Skin: Negative for rash.  Neurological: Negative for headaches.  Hematological: Negative for adenopathy.  Psychiatric/Behavioral:       Denies depression or anxiety   Past Medical History  Diagnosis Date  . Osteoporosis   . Chronic low back pain   . DDD (degenerative disc disease), lumbar   . Chronic lymphocytic leukemia   . Hx of adenomatous colonic polyps     no polyps in 1/08  . Hyperlipidemia   . Glaucoma(365)   . Heart murmur   . History of blood transfusion     due to fracture of femur    History   Social History  . Marital Status: Single     Spouse Name: N/A    Number of Children: 1  . Years of Education: N/A   Occupational History  .     Social History Main Topics  . Smoking status: Never Smoker   . Smokeless tobacco: Never Used  . Alcohol Use: 4.2 oz/week    7 Glasses of wine per week     Comment: 1 glass wine daily  . Drug Use: No  . Sexually Active: Not on file   Other Topics Concern  . Not on file   Social History Narrative   Widowed x 2Daughter and 3 grandkids in [place], [place] Bachelor's DegreeRetired- early television ([employer]),  Volunteering, photography- still enjoysEnjoys travel- goes with Yvone Neu    Past Surgical History  Procedure Date  . Breast enhancement surgery 1978, 1980    with silicone  . Knee arthroscopy 1996    right  . Repair of crushed femur 1997  . Cataract extraction     right eye  . Abdominal hysterectomy 2013    at [facility]    Family History  Problem Relation Age of Onset  . Colon cancer Mother     Died from colon caner   . Esophageal cancer Father   . Arthritis Father   . Heart disease Father   . Fibromyalgia Daughter   .  Stroke Maternal Grandmother     No Known Allergies  Current Outpatient Prescriptions on File Prior to Visit  Medication Sig Dispense Refill  . acetaminophen (TYLENOL) 500 MG tablet Take 500 mg by mouth daily as needed.        Marland Kitchen aspirin 81 MG tablet Take 81 mg by mouth daily.        . Calcium-Magnesium-Vitamin D (CALCIUM MAGNESIUM PO) Take 30 mLs by mouth daily.      Marland Kitchen latanoprost (XALATAN) 0.005 % ophthalmic solution       . naproxen sodium (ANAPROX) 220 MG tablet Take 220 mg by mouth 2 (two) times daily with a meal.        . Omega-3 Fatty Acids (FISH OIL) 1000 MG CAPS Take 1 capsule by mouth 2 (two) times daily.        . Red Yeast Rice 600 MG CAPS Take 1 capsule by mouth 2 (two) times daily.        Marland Kitchen zolpidem (AMBIEN CR) 6.25 MG CR tablet       . Calcium Carbonate-Vitamin D (CALCIUM 600+D PO) Take by mouth.        BP 130/78  Pulse 77   Temp 97.6 F (36.4 C) (Oral)  Resp 16  Ht 5\' 2"  (1.575 m)  Wt 127 lb 0.6 oz (57.625 kg)  BMI 23.24 kg/m2  SpO2 99%       Objective:   Physical Exam  Constitutional: She is oriented to person, place, and time. She appears well-developed and well-nourished. No distress.  HENT:  Head: Normocephalic and atraumatic.  Eyes: No scleral icterus.  Cardiovascular: Normal rate and regular rhythm.   No murmur heard. Pulmonary/Chest: Effort normal and breath sounds normal. No respiratory distress. She has no wheezes. She has no rales. She exhibits no tenderness.  Musculoskeletal:       1+ bilateral LE edema.   Neurological: She is alert and oriented to person, place, and time.  Skin: Skin is warm and dry.  Psychiatric: She has a normal mood and affect. Her behavior is normal. Judgment and thought content normal.          Assessment & Plan:

## 2012-07-31 NOTE — Assessment & Plan Note (Signed)
On vitamin D supplement, continue same.

## 2012-07-31 NOTE — Patient Instructions (Addendum)
Please call if sinus symptoms worsen, or if no improvement in 1 week. Please schedule a fasting medicare wellness visit. Welcome to [facility]!

## 2012-07-31 NOTE — Addendum Note (Signed)
Addended by: Sandford Craze on: 07/31/2012 11:52 AM   Modules accepted: Level of Service

## 2012-07-31 NOTE — Assessment & Plan Note (Signed)
BP looks good today.  Continue low sodium diet, exercise.

## 2012-07-31 NOTE — Assessment & Plan Note (Signed)
She take xalatan- this is managed by Opthalmology.

## 2012-07-31 NOTE — Assessment & Plan Note (Signed)
This managed by Dr. Arline Asp.

## 2012-07-31 NOTE — Assessment & Plan Note (Signed)
She stopped boniva stating that "it wasn't helping." she will bring her last dexa to [place] next visit for review.  Continue caltrate and vitamin D.

## 2012-07-31 NOTE — Assessment & Plan Note (Signed)
Will rx with augmentin.  

## 2012-07-31 NOTE — Assessment & Plan Note (Signed)
She is treating her back pain with physical therapy. Continue same.

## 2012-08-03 ENCOUNTER — Ambulatory Visit: Payer: Medicare Other | Admitting: Physical Therapy

## 2012-08-06 ENCOUNTER — Ambulatory Visit: Payer: Medicare Other | Admitting: Physical Therapy

## 2012-08-10 ENCOUNTER — Ambulatory Visit: Payer: Medicare Other | Admitting: Physical Therapy

## 2012-08-12 ENCOUNTER — Ambulatory Visit: Payer: Medicare Other | Admitting: Physical Therapy

## 2012-08-17 ENCOUNTER — Ambulatory Visit: Payer: Medicare Other | Attending: Orthopedic Surgery | Admitting: Physical Therapy

## 2012-08-17 DIAGNOSIS — IMO0001 Reserved for inherently not codable concepts without codable children: Secondary | ICD-10-CM | POA: Diagnosis not present

## 2012-08-17 DIAGNOSIS — M25559 Pain in unspecified hip: Secondary | ICD-10-CM | POA: Diagnosis not present

## 2012-08-17 DIAGNOSIS — R262 Difficulty in walking, not elsewhere classified: Secondary | ICD-10-CM | POA: Diagnosis not present

## 2012-08-18 ENCOUNTER — Ambulatory Visit (INDEPENDENT_AMBULATORY_CARE_PROVIDER_SITE_OTHER): Payer: Medicare Other | Admitting: Family

## 2012-08-18 ENCOUNTER — Encounter: Payer: Self-pay | Admitting: Family

## 2012-08-18 ENCOUNTER — Ambulatory Visit: Payer: Medicare Other | Admitting: Family

## 2012-08-18 ENCOUNTER — Telehealth: Payer: Self-pay | Admitting: *Deleted

## 2012-08-18 VITALS — BP 110/76 | HR 82 | Temp 97.6°F | Resp 16 | Ht 62.0 in | Wt 128.0 lb

## 2012-08-18 DIAGNOSIS — Z0000 Encounter for general adult medical examination without abnormal findings: Secondary | ICD-10-CM

## 2012-08-18 DIAGNOSIS — R609 Edema, unspecified: Secondary | ICD-10-CM | POA: Diagnosis not present

## 2012-08-18 DIAGNOSIS — N289 Disorder of kidney and ureter, unspecified: Secondary | ICD-10-CM

## 2012-08-18 DIAGNOSIS — E559 Vitamin D deficiency, unspecified: Secondary | ICD-10-CM

## 2012-08-18 DIAGNOSIS — Z1331 Encounter for screening for depression: Secondary | ICD-10-CM | POA: Diagnosis not present

## 2012-08-18 DIAGNOSIS — D72829 Elevated white blood cell count, unspecified: Secondary | ICD-10-CM | POA: Diagnosis not present

## 2012-08-18 DIAGNOSIS — Z1231 Encounter for screening mammogram for malignant neoplasm of breast: Secondary | ICD-10-CM

## 2012-08-18 DIAGNOSIS — R319 Hematuria, unspecified: Secondary | ICD-10-CM | POA: Diagnosis not present

## 2012-08-18 LAB — BASIC METABOLIC PANEL
BUN: 20 mg/dL (ref 6–23)
Chloride: 99 mEq/L (ref 96–112)
Potassium: 4.8 mEq/L (ref 3.5–5.3)
Sodium: 140 mEq/L (ref 135–145)

## 2012-08-18 NOTE — Progress Notes (Signed)
Subjective:    Patient ID: Alison Hammond, female    DOB: 1925-01-16, 78 y.o.   MRN: [id number]  HPI  Subjective:   Patient here for Medicare annual wellness visit   Pt here for medicare wellness exam. Last colonoscopy 2012. Not sure of last tetanus shot <10 yrs. Reports 2 years since last EKG. Reports pneumonia shot after age 71.  Sh has also had singles vaccine.  Risk factors: at risk for fall.   Roster of Physicians Providing Medical Care to Patient: Dr. Estanislado Pandy Dr. Armen Pickup ([facility]) for back- recommended PT.  Activities of Daily Living  In your present state of health, do you have any difficulty performing the following activities? Preparing food and eating?: No  Bathing yourself: No  Getting dressed: No  Using the toilet:No  Moving around from place to place: No  In the past year have you fallen or had a near fall?:No     Diet and Exercise  Current exercise habits: Regular exercise Dietary issues discussed: healthy diet   Depression Screen  (Note: if answer to either of the following is "Yes", then a more complete depression screening is indicated)  Q1: Over the past two weeks, have you felt down, depressed or hopeless?no  Q2: Over the past two weeks, have you felt little interest or pleasure in doing things? no   The following portions of the patient's history were reviewed and updated as appropriate: allergies, current medications, past family history, past medical history, past social history, past surgical history and problem list.    Objective:   Vision: see nursing Hearing: unable to hear forced whisper at 6 feet.  Body mass index: see nursing Cognitive Impairment Assessment: cognition, memory and judgment appear normal.   Assessment:   Medicare wellness utd on preventive parameters  Plan:    During the course of the visit the patient was educated and counseled about appropriate screening and preventive services including:       Fall prevention   Screening  mammography- today Bone densitometry screening- she will provide [place] with copy. Diabetes screening-obtain bmet  Vaccines / LABS  Immunizations reviewed and up to date  Patient Instructions (the written plan) was given to the patient.       Review of Systems     Objective:   Physical Exam  Physical Exam  Constitutional: She is oriented to person, place, and time. She appears well-developed and well-nourished. No distress.  HENT:  Head: Normocephalic and atraumatic.  Right Ear: Tympanic membrane and ear canal normal.  Left Ear: Tympanic membrane and ear canal normal.  Mouth/Throat: Oropharynx is clear and moist.  Eyes: Pupils are equal, round, and reactive to light. No scleral icterus.  Neck: Normal range of motion. No thyromegaly present.  Cardiovascular: Normal rate and regular rhythm.   No murmur heard. Pulmonary/Chest: Effort normal and breath sounds normal. No respiratory distress. He has no wheezes. She has no rales. She exhibits no tenderness.  Abdominal: Soft. Bowel sounds are normal. He exhibits no distension and no mass. There is no tenderness. There is no rebound and no guarding.  Musculoskeletal: She exhibits 2+ bilateral LE edema.  Lymphadenopathy:    She has no cervical adenopathy.  Neurological: She is alert and oriented to person, place, and time. She has normal reflexes. She exhibits normal muscle tone. Coordination normal.  Skin: Skin is warm and dry.  Psychiatric: She has a normal mood and affect. Her behavior is normal. Judgment and thought content normal.  Breasts: Examined lying- both  breasts are hard due to calcified bilateral breast implants.  Exam limited as a result.         Assessment & Plan:         Assessment & Plan:

## 2012-08-18 NOTE — Telephone Encounter (Signed)
Pt presented to the lab for blood work prior to today's physical. Orders entered per verbal from Provider for: CBC (leukocytosis), BMP- (renal insufficiency), Vit d (vit d deficiency) and U/A (hematuria). Pt will return at 11:15am for medicare wellness exam.

## 2012-08-18 NOTE — Patient Instructions (Addendum)
Please follow up in 6 months.  Happy Holidays!

## 2012-08-19 ENCOUNTER — Encounter: Payer: Self-pay | Admitting: Family

## 2012-08-19 LAB — URINALYSIS, ROUTINE W REFLEX MICROSCOPIC
Bilirubin Urine: NEGATIVE
Glucose, UA: NEGATIVE mg/dL
Leukocytes, UA: NEGATIVE
Protein, ur: NEGATIVE mg/dL
Specific Gravity, Urine: 1.016 (ref 1.005–1.030)
pH: 8 (ref 5.0–8.0)

## 2012-08-19 LAB — CBC WITH DIFFERENTIAL/PLATELET
HCT: 39.7 % (ref 36.0–46.0)
Hemoglobin: 13.1 g/dL (ref 12.0–15.0)
Lymphocytes Relative: 69 % — ABNORMAL HIGH (ref 12–46)
Lymphs Abs: 10.9 10*3/uL — ABNORMAL HIGH (ref 0.7–4.0)
Monocytes Absolute: 0.7 10*3/uL (ref 0.1–1.0)
Monocytes Relative: 4 % (ref 3–12)
Neutro Abs: 4.2 10*3/uL (ref 1.7–7.7)
Neutrophils Relative %: 26 % — ABNORMAL LOW (ref 43–77)
RBC: 4.3 MIL/uL (ref 3.87–5.11)
WBC: 16 10*3/uL — ABNORMAL HIGH (ref 4.0–10.5)

## 2012-08-19 LAB — VITAMIN D 25 HYDROXY (VIT D DEFICIENCY, FRACTURES): Vit D, 25-Hydroxy: 43 ng/mL (ref 30–89)

## 2012-08-19 LAB — PATHOLOGIST SMEAR REVIEW

## 2012-08-20 ENCOUNTER — Ambulatory Visit: Payer: Medicare Other | Admitting: Physical Therapy

## 2012-08-20 DIAGNOSIS — IMO0001 Reserved for inherently not codable concepts without codable children: Secondary | ICD-10-CM | POA: Diagnosis not present

## 2012-08-20 DIAGNOSIS — M25559 Pain in unspecified hip: Secondary | ICD-10-CM | POA: Diagnosis not present

## 2012-08-20 DIAGNOSIS — R262 Difficulty in walking, not elsewhere classified: Secondary | ICD-10-CM | POA: Diagnosis not present

## 2012-08-25 ENCOUNTER — Ambulatory Visit: Payer: Medicare Other | Admitting: Physical Therapy

## 2012-08-25 DIAGNOSIS — M25559 Pain in unspecified hip: Secondary | ICD-10-CM | POA: Diagnosis not present

## 2012-08-25 DIAGNOSIS — R262 Difficulty in walking, not elsewhere classified: Secondary | ICD-10-CM | POA: Diagnosis not present

## 2012-08-25 DIAGNOSIS — IMO0001 Reserved for inherently not codable concepts without codable children: Secondary | ICD-10-CM | POA: Diagnosis not present

## 2012-08-27 ENCOUNTER — Ambulatory Visit: Payer: Medicare Other | Admitting: Physical Therapy

## 2012-08-27 DIAGNOSIS — IMO0001 Reserved for inherently not codable concepts without codable children: Secondary | ICD-10-CM | POA: Diagnosis not present

## 2012-08-27 DIAGNOSIS — M25559 Pain in unspecified hip: Secondary | ICD-10-CM | POA: Diagnosis not present

## 2012-08-27 DIAGNOSIS — R262 Difficulty in walking, not elsewhere classified: Secondary | ICD-10-CM | POA: Diagnosis not present

## 2012-08-31 ENCOUNTER — Ambulatory Visit: Payer: Medicare Other | Admitting: Physical Therapy

## 2012-08-31 DIAGNOSIS — M25559 Pain in unspecified hip: Secondary | ICD-10-CM | POA: Diagnosis not present

## 2012-08-31 DIAGNOSIS — IMO0001 Reserved for inherently not codable concepts without codable children: Secondary | ICD-10-CM | POA: Diagnosis not present

## 2012-08-31 DIAGNOSIS — R262 Difficulty in walking, not elsewhere classified: Secondary | ICD-10-CM | POA: Diagnosis not present

## 2012-09-03 ENCOUNTER — Ambulatory Visit: Payer: Medicare Other | Admitting: Physical Therapy

## 2012-09-03 DIAGNOSIS — IMO0001 Reserved for inherently not codable concepts without codable children: Secondary | ICD-10-CM | POA: Diagnosis not present

## 2012-09-03 DIAGNOSIS — R262 Difficulty in walking, not elsewhere classified: Secondary | ICD-10-CM | POA: Diagnosis not present

## 2012-09-03 DIAGNOSIS — M25559 Pain in unspecified hip: Secondary | ICD-10-CM | POA: Diagnosis not present

## 2012-09-10 ENCOUNTER — Ambulatory Visit: Payer: Medicare Other | Admitting: Physical Therapy

## 2012-10-02 DIAGNOSIS — J301 Allergic rhinitis due to pollen: Secondary | ICD-10-CM | POA: Diagnosis not present

## 2012-10-02 DIAGNOSIS — R0982 Postnasal drip: Secondary | ICD-10-CM | POA: Diagnosis not present

## 2012-11-05 ENCOUNTER — Other Ambulatory Visit: Payer: Medicare Other | Admitting: Lab

## 2012-11-06 ENCOUNTER — Other Ambulatory Visit: Payer: Medicare Other | Admitting: Lab

## 2012-11-06 ENCOUNTER — Other Ambulatory Visit: Payer: Self-pay | Admitting: Physician Assistant

## 2012-11-06 ENCOUNTER — Telehealth: Payer: Self-pay | Admitting: Oncology

## 2012-11-06 ENCOUNTER — Ambulatory Visit (HOSPITAL_BASED_OUTPATIENT_CLINIC_OR_DEPARTMENT_OTHER): Payer: Medicare Other | Admitting: Physician Assistant

## 2012-11-06 VITALS — BP 161/71 | HR 77 | Temp 97.3°F | Resp 97 | Ht 62.0 in | Wt 128.6 lb

## 2012-11-06 DIAGNOSIS — C9110 Chronic lymphocytic leukemia of B-cell type not having achieved remission: Secondary | ICD-10-CM | POA: Diagnosis not present

## 2012-11-06 DIAGNOSIS — M810 Age-related osteoporosis without current pathological fracture: Secondary | ICD-10-CM

## 2012-11-06 LAB — COMPREHENSIVE METABOLIC PANEL (CC13)
AST: 21 U/L (ref 5–34)
Albumin: 3.4 g/dL — ABNORMAL LOW (ref 3.5–5.0)
Alkaline Phosphatase: 56 U/L (ref 40–150)
BUN: 24.1 mg/dL (ref 7.0–26.0)
Calcium: 9.2 mg/dL (ref 8.4–10.4)
Creatinine: 0.9 mg/dL (ref 0.6–1.1)
Potassium: 4.5 mEq/L (ref 3.5–5.1)
Total Protein: 6.6 g/dL (ref 6.4–8.3)

## 2012-11-06 LAB — CBC WITH DIFFERENTIAL/PLATELET
BASO%: 0.3 % (ref 0.0–2.0)
EOS%: 0.6 % (ref 0.0–7.0)
HCT: 39.1 % (ref 34.8–46.6)
LYMPH%: 72.5 % — ABNORMAL HIGH (ref 14.0–49.7)
MCH: 30.8 pg (ref 25.1–34.0)
MCHC: 33.8 g/dL (ref 31.5–36.0)
MONO#: 0.4 10*3/uL (ref 0.1–0.9)
NEUT%: 23.4 % — ABNORMAL LOW (ref 38.4–76.8)
RBC: 4.29 10*6/uL (ref 3.70–5.45)
WBC: 12.6 10*3/uL — ABNORMAL HIGH (ref 3.9–10.3)
lymph#: 9.1 10*3/uL — ABNORMAL HIGH (ref 0.9–3.3)

## 2012-11-06 LAB — TECHNOLOGIST REVIEW

## 2012-11-06 LAB — LACTATE DEHYDROGENASE (CC13): LDH: 145 U/L (ref 125–245)

## 2012-11-06 NOTE — Patient Instructions (Addendum)
Follow up in one year with labs with Dr. Arline Asp. Interim CBC in 6 months

## 2012-11-06 NOTE — Telephone Encounter (Signed)
Gave pt appt for lab and MD for February 2015 °

## 2012-11-06 NOTE — Progress Notes (Signed)
[facility]  [phone]   PROBLEM LIST:  1. Chronic lymphocytic leukemia, stage 0 with diagnostic flow cytometry on the peripheral blood on 01/10/2004.  2. History of colonic adenomatous polyps. Last colonoscopy was  carried out during the summer of 2012.  3. Irritable bowel syndrome.  4. Mitral valve prolapse.  5. GERD.  6. Osteoporosis.  7. History of left femoral fracture requiring rod insertion in 1997.  8. Glaucoma involving the right eye. 9. History of ovarian cyst, s/p laparoscopic hysterectomy MAy 2013, negative for malignancy (Dr. Nedra Hai)  HOSPITAL PROGRESS NOTE  Alison Hammond is an 79 year old white female, currently residing at [facility] in [place]. The patient is followed for stage 0 chronic lymphocytic leukemia dating back to early part of 2005, i.e., 9 years ago. The patient was last seen by [place] on December 11/07/2011 The patient's overall condition is stable.The patient remains extremely active and mentally sharp. There been no health issues. There are no symptoms related to her severe chronic lymphocytic leukemia. smear review on 08/18/12 showed bsolute lymphocytosis and atypical lymphocytes, suggestive of a lymphoproliferative process.  Her last medicare wellness exam was performed on 08/18/12 by Dr. Peggyann Juba at [facility]. Last colonoscopy 2012. Last mammogram was done on 08/28/12 Dr Nedra Hai at [facility] evaluated an increasing ovarian cyst with mild change in CA-125 ( from 24 to 49) associated with thickening of endometrium, for which she underwent a laparoscopic hysterectomy  with BSO in May 2013. Records are not available for review.  She is no longer on Boniva (d/c'd on 07/2012), continuing with Calcium and Vitamin D.   MEDICATIONS:  Current Outpatient Prescriptions  Medication Sig Dispense Refill  . acetaminophen (TYLENOL) 500 MG tablet Take 500 mg by mouth daily as needed.        Marland Kitchen aspirin 81 MG tablet Take 81 mg by mouth daily.        .  Calcium Carbonate-Vitamin D (CALCIUM 600+D PO) Take by mouth.      . Calcium-Magnesium-Vitamin D (CALCIUM MAGNESIUM PO) Take 30 mLs by mouth daily.      . Cholecalciferol (VITAMIN D) 2000 UNITS CAPS Take 1 capsule by mouth daily.      Marland Kitchen latanoprost (XALATAN) 0.005 % ophthalmic solution       . Omega-3 Fatty Acids (FISH OIL) 1000 MG CAPS Take 1 capsule by mouth 2 (two) times daily.        . Red Yeast Rice 600 MG CAPS Take 1 capsule by mouth 2 (two) times daily.        Marland Kitchen zolpidem (AMBIEN CR) 6.25 MG CR tablet       . amoxicillin-clavulanate (AUGMENTIN) 875-125 MG per tablet Take 1 tablet by mouth 2 (two) times daily.  20 tablet  0  . naproxen sodium (ANAPROX) 220 MG tablet Take 220 mg by mouth 2 (two) times daily with a meal.         No current facility-administered medications for this visit.  Pneumovax was given in 2010.  Influenza shot given in the fall of 2013.   ALLERGIES:  No Known Allergies   PHYSICAL EXAMINATION:   Filed Vitals:   11/06/12 0936  BP: 161/71  Pulse: 77  Temp: 97.3 F (36.3 C)  Resp: 97   Filed Weights   11/06/12 0936  Weight: 128 lb 9.6 oz (58.333 kg)    looks well, somewhat younger in appearance and demeanor than her stated age, soon to be 49.  HEENT: There is no scleral icterus.  Mouth and pharynx are benign. No peripheral adenopathy palpable in the neck, supraclavicular, axillary, or inguinal areas. Heart and lungs are normal. I did not appreciate a murmur. Breasts were not examined. The patient is due for mammograms. Her last mammograms were in 06/2012. Abdomen: Benign with no organomegaly or masses palpable. Extremities: No peripheral edema or clubbing. She has a thick sole involving her left shoe, where she had a fracture involving her left femur in 1997. She uses a cane. Neurologic exam is nonfocal. No skin rashes, petechiae, or purpura.     LABORATORY/RADIOLOGY DATA:   Recent Labs Lab 11/06/12 0806  WBC 12.6*  HGB 13.2  HCT 39.1  PLT 218  MCV  91.0  MCH 30.8  MCHC 33.8  RDW 14.0  LYMPHSABS 9.1*  MONOABS 0.4  EOSABS 0.1  BASOSABS 0.0    On 11/07/11 white count 15.4, ANC 4.6, hemoglobin 13.3, hematocrit 39.6, and platelets 244,000. Neutrophils 30%, lymphocytes 64.4%. Absolute lymphocyte count was 9.9. CBC has been stable. Chemistries today are pending. Chemistries from 2/212013 were normal  CMP    Recent Labs Lab 11/06/12 0806  NA 138  K 4.5  CL 101  CO2 29  GLUCOSE 151*  BUN 24.1  CREATININE 0.9  CALCIUM 9.2  AST 21  ALT 20  ALKPHOS 56  BILITOT 0.59        Component Value Date/Time   BILITOT 0.59 11/06/2012 0806   BILITOT 0.6 11/07/2011 1107     Radiology Studies:  IMAGING STUDIES:  1. Chest x-ray 2 views from 06/24/2009 showed emphysema without acute cardiopulmonary process.  2. Bone density scan from January 2012 carried out at the [facility] showed a T-score of the right hip of -2.7, and right femoral neck of -3.0. The patient was felt to have osteoporosis by WHO criteria. It was noted the patient had severe scoliosis which may be difficult to interpret certain points of the spine. There was evidence of a compression deformity at approximately L1 suggesting and consistent with a diagnosis of osteoporosis.    ASSESSMENT AND PLAN:    Mr. Chalupa continues to do extremely well on no therapy for her stage 0 chronic lymphocytic leukemia. She has never had any treatment. The patient's overall health is excellent. She is fully active and mentally sharp. We will plan to see her again in 1 year at which time we will check CBC and chemistries. We will check CBC in 6 months.   Marlowe Kays E 11/06/2012, 1:54 PM

## 2012-11-27 DIAGNOSIS — L570 Actinic keratosis: Secondary | ICD-10-CM | POA: Diagnosis not present

## 2012-11-27 DIAGNOSIS — L821 Other seborrheic keratosis: Secondary | ICD-10-CM | POA: Diagnosis not present

## 2012-12-23 ENCOUNTER — Telehealth: Payer: Self-pay | Admitting: *Deleted

## 2012-12-23 NOTE — Telephone Encounter (Signed)
Pt called stating she has appt with [place] tomorrow. Wanted to know if she should have lab work done before follow up. Advised pt to be fasting at tomorrow's appt and we will give order for labs at that time. Pt voices understanding.

## 2012-12-24 ENCOUNTER — Ambulatory Visit (INDEPENDENT_AMBULATORY_CARE_PROVIDER_SITE_OTHER): Payer: Medicare Other | Admitting: Family

## 2012-12-24 ENCOUNTER — Encounter: Payer: Self-pay | Admitting: Family

## 2012-12-24 VITALS — BP 140/80 | HR 66 | Temp 97.6°F | Resp 16 | Ht 62.0 in | Wt 130.1 lb

## 2012-12-24 DIAGNOSIS — R030 Elevated blood-pressure reading, without diagnosis of hypertension: Secondary | ICD-10-CM | POA: Diagnosis not present

## 2012-12-24 DIAGNOSIS — R7309 Other abnormal glucose: Secondary | ICD-10-CM | POA: Diagnosis not present

## 2012-12-24 DIAGNOSIS — R739 Hyperglycemia, unspecified: Secondary | ICD-10-CM

## 2012-12-24 DIAGNOSIS — M810 Age-related osteoporosis without current pathological fracture: Secondary | ICD-10-CM

## 2012-12-24 DIAGNOSIS — J309 Allergic rhinitis, unspecified: Secondary | ICD-10-CM

## 2012-12-24 LAB — HEMOGLOBIN A1C: Mean Plasma Glucose: 111 mg/dL (ref <117)

## 2012-12-24 MED ORDER — FLUTICASONE PROPIONATE 50 MCG/ACT NA SUSP: 2.0000 | Freq: Every day | NASAL | Status: DC

## 2012-12-24 NOTE — Progress Notes (Signed)
Subjective:    Patient ID: Alison Hammond, female    DOB: [birth year], 79 y.o.   MRN: [id number]  HPI  Alison Hammond is an 79 yr old female who presents today for follow up.  1) Osteoporosis-she has hx of osteoporosis- last dexa 2012. In the past she has been on "the needle" for a year.  She was treated with fosamax but stopped and switched to prolia due to the "bad publicity."  2) Elevated blood pressure- Not currently on meds.   3) Vitamin D deficiency- she is currently on vitamin D supplement.   4) Allergic rhinitis- she reports that she has had some nasal drainage which she has been using an otc med for.     Review of Systems    see HPI  Past Medical History  Diagnosis Date  . Osteoporosis   . Chronic low back pain   . DDD (degenerative disc disease), lumbar   . Chronic lymphocytic leukemia   . Hx of adenomatous colonic polyps     no polyps in 1/08  . Hyperlipidemia   . Glaucoma(365)   . Heart murmur   . History of blood transfusion     due to fracture of femur    History   Social History  . Marital Status: Single    Spouse Name: N/A    Number of Children: 1  . Years of Education: N/A   Occupational History  .     Social History Main Topics  . Smoking status: Never Smoker   . Smokeless tobacco: Never Used  . Alcohol Use: 4.2 oz/week    7 Glasses of wine per week     Comment: 1 glass wine daily  . Drug Use: No  . Sexually Active: Not on file   Other Topics Concern  . 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 Breast enhancement surgery  1978, 1980    with silicone  . Knee arthroscopy  1996    right  . Repair of crushed femur  1997  . Cataract extraction      right eye. Also has bilateral eye  implants for distance and close up vision.  . Abdominal hysterectomy  2013    at [facility]    Family History  Problem Relation Age of Onset  . Colon cancer Mother     Died from colon caner   . Esophageal cancer Father   . Arthritis Father   . Heart disease Father   . Fibromyalgia Daughter   . Stroke Maternal Grandmother     No Known Allergies  Current Outpatient Prescriptions on File Prior to Visit  Medication Sig Dispense Refill  . aspirin 81 MG tablet Take 81 mg by mouth daily.        . Calcium-Magnesium-Vitamin D (CALCIUM MAGNESIUM PO) Take 30 mLs by mouth daily.      . Cholecalciferol (VITAMIN D) 2000 UNITS CAPS Take 1 capsule by mouth daily.      Marland Kitchen latanoprost (XALATAN) 0.005 % ophthalmic solution       . Omega-3 Fatty Acids (FISH OIL) 1000 MG CAPS Take 1 capsule by mouth 2 (two) times daily.        Marland Kitchen  Red Yeast Rice 600 MG CAPS Take 1 capsule by mouth 2 (two) times daily.        Marland Kitchen zolpidem (AMBIEN CR) 6.25 MG CR tablet       . acetaminophen (TYLENOL) 500 MG tablet Take 500 mg by mouth daily as needed.         No current facility-administered medications on file prior to visit.    BP 140/80  Pulse 66  Temp(Src) 97.6 F (36.4 C) (Oral)  Resp 16  Ht 5\' 2"  (1.575 m)  Wt 130 lb 1.3 oz (59.004 kg)  BMI 23.79 kg/m2  SpO2 98%    Objective:   Physical Exam  Constitutional: She is oriented to person, place, and time. She appears well-developed and well-nourished. No distress.  HENT:  Head: Normocephalic and atraumatic.  Cardiovascular: Normal rate and regular rhythm.   No murmur heard. Pulmonary/Chest: Effort normal and breath sounds normal. No respiratory distress. She has no wheezes. She has no rales. She exhibits no tenderness.  Musculoskeletal: She exhibits no edema.  Neurological: She is alert and oriented to person, place, and time.  Psychiatric: She has a normal mood and affect. Her behavior is normal. Judgment and thought content normal.           Assessment & Plan:

## 2012-12-24 NOTE — Patient Instructions (Addendum)
Please complete lab work prior to leaving. Schedule bone density at the front desk. Follow up in 6 months.  

## 2012-12-25 ENCOUNTER — Encounter: Payer: Self-pay | Admitting: Family

## 2012-12-27 DIAGNOSIS — J309 Allergic rhinitis, unspecified: Secondary | ICD-10-CM | POA: Insufficient documentation

## 2012-12-27 NOTE — Assessment & Plan Note (Signed)
Trial of flonase 

## 2012-12-27 NOTE — Assessment & Plan Note (Signed)
Due for dexa, will order. Continue calcium and vitamin d.

## 2012-12-27 NOTE — Assessment & Plan Note (Signed)
BP Readings from Last 3 Encounters:  12/24/12 140/80  11/06/12 161/71  08/18/12 110/76   Fair BP control with low sodium diet, monitor.

## 2012-12-28 ENCOUNTER — Telehealth: Payer: Self-pay | Admitting: Family

## 2012-12-28 NOTE — Telephone Encounter (Signed)
SHE CAME BY AN PICKED UP A COPY OF HER RECENT LABWORK RESULTS.  IT INDICATES HER HEMOGLOBIN A1C WAS HIGH AND SHE IS AT RISK FOR DIABETES.  SHE WANTS TO KNOW WHAT TO DO TO AVOID GETTING DIABETES

## 2012-12-28 NOTE — Telephone Encounter (Signed)
Please advise 

## 2012-12-28 NOTE — Telephone Encounter (Signed)
Pt informed and voiced understanding

## 2012-12-28 NOTE — Telephone Encounter (Signed)
Her A1C is normal. She is not diabetic.  She did have an elevated sugar 1 month ago but it was not in diabetic range.  She should continue healthy diet.

## 2013-01-15 DIAGNOSIS — J301 Allergic rhinitis due to pollen: Secondary | ICD-10-CM | POA: Diagnosis not present

## 2013-01-15 DIAGNOSIS — R040 Epistaxis: Secondary | ICD-10-CM | POA: Diagnosis not present

## 2013-01-18 ENCOUNTER — Ambulatory Visit: Payer: Medicare Other | Admitting: Family

## 2013-02-02 DIAGNOSIS — M810 Age-related osteoporosis without current pathological fracture: Secondary | ICD-10-CM | POA: Diagnosis not present

## 2013-02-02 DIAGNOSIS — Z124 Encounter for screening for malignant neoplasm of cervix: Secondary | ICD-10-CM | POA: Diagnosis not present

## 2013-02-19 DIAGNOSIS — L820 Inflamed seborrheic keratosis: Secondary | ICD-10-CM | POA: Diagnosis not present

## 2013-02-19 DIAGNOSIS — L570 Actinic keratosis: Secondary | ICD-10-CM | POA: Diagnosis not present

## 2013-04-20 DIAGNOSIS — M48061 Spinal stenosis, lumbar region without neurogenic claudication: Secondary | ICD-10-CM | POA: Diagnosis not present

## 2013-04-21 ENCOUNTER — Ambulatory Visit (INDEPENDENT_AMBULATORY_CARE_PROVIDER_SITE_OTHER): Payer: Medicare Other | Admitting: Family

## 2013-04-21 ENCOUNTER — Encounter: Payer: Self-pay | Admitting: Family

## 2013-04-21 VITALS — BP 110/60 | HR 90 | Temp 97.7°F | Resp 14 | Wt 129.0 lb

## 2013-04-21 DIAGNOSIS — L210 Seborrhea capitis: Secondary | ICD-10-CM | POA: Insufficient documentation

## 2013-04-21 DIAGNOSIS — L218 Other seborrheic dermatitis: Secondary | ICD-10-CM

## 2013-04-21 DIAGNOSIS — D229 Melanocytic nevi, unspecified: Secondary | ICD-10-CM

## 2013-04-21 DIAGNOSIS — D239 Other benign neoplasm of skin, unspecified: Secondary | ICD-10-CM

## 2013-04-21 NOTE — Patient Instructions (Addendum)
You will be contacted about your referral to dermatology.  Please let us know if you have not heard back within 1 week about your referral.

## 2013-04-21 NOTE — Progress Notes (Signed)
Subjective:    Patient ID: Alison Hammond, female    DOB: 01/17/1925, 79 y.o.   MRN: [id number]  HPI  Alison Hammond is an 79 yr old female who presents today with chief complaint of itching on her shoulders. Reports that this started 3 weeks ago.  She has been using a coal tar formula [id number] along with a psoriasis cream which contains 2% salicyclic acid. She notes that the shampoo is helping and the cream is helping.   She reports that ortho gave her prednisone due to pain from her spinal stenosis.  She has not seen dermatology in some time.  Wold like referral for skin exam. Has had multiple moles removed in the past.    Review of Systems    see HPI  Past Medical History  Diagnosis Date  . Osteoporosis   . Chronic low back pain   . DDD (degenerative disc disease), lumbar   . Chronic lymphocytic leukemia   . Hx of adenomatous colonic polyps     no polyps in 1/08  . Hyperlipidemia   . Glaucoma   . Heart murmur   . History of blood transfusion     due to fracture of femur    History   Social History  . Marital Status: Single    Spouse Name: N/A    Number of Children: 1  . Years of Education: N/A   Occupational History  .     Social History Main Topics  . Smoking status: Never Smoker   . Smokeless tobacco: Never Used  . Alcohol Use: 4.2 oz/week    7 Glasses of wine per week     Comment: 1 glass wine daily  . Drug Use: No  . Sexually Active: Not on file   Other Topics Concern  . 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 Breast enhancement surgery  1978, 1980    with silicone  . Knee arthroscopy  1996    right  . Repair of crushed femur  1997  . Cataract extraction      right eye. Also has bilateral eye  implants for distance and close up vision.  . Abdominal hysterectomy  2013    at [facility]    Family History  Problem Relation Age of Onset  . Colon cancer Mother     Died from colon caner   . Esophageal cancer Father   . Arthritis Father   . Heart disease Father   . Fibromyalgia Daughter   . Stroke Maternal Grandmother     No Known Allergies  Current Outpatient Prescriptions on File Prior to Visit  Medication Sig Dispense Refill  . acetaminophen (TYLENOL) 500 MG tablet Take 500 mg by mouth daily as needed.        Marland Kitchen aspirin 81 MG tablet Take 81 mg by mouth daily.        . Calcium-Magnesium-Vitamin D (CALCIUM MAGNESIUM PO) Take 30 mLs by mouth daily.      . Cholecalciferol (VITAMIN D) 2000 UNITS CAPS Take 1 capsule by mouth daily.      . fluticasone (FLONASE) 50 MCG/ACT nasal spray Place 2 sprays into the  nose daily.  16 g  3  . latanoprost (XALATAN) 0.005 % ophthalmic solution       . Multiple Vitamins-Minerals (HAIR/SKIN/NAILS) TABS Take 1 tablet by mouth daily.      . Omega-3 Fatty Acids (FISH OIL) 1000 MG CAPS Take 1 capsule by mouth 2 (two) times daily.        . Red Yeast Rice 600 MG CAPS Take 1 capsule by mouth 2 (two) times daily.        Marland Kitchen zolpidem (AMBIEN CR) 6.25 MG CR tablet        No current facility-administered medications on file prior to visit.    BP 110/60  Pulse 90  Temp(Src) 97.7 F (36.5 C) (Oral)  Resp 14  Wt 129 lb (58.514 kg)  BMI 23.59 kg/m2  SpO2 99%    Objective:   Physical Exam  Constitutional: She appears well-developed and well-nourished. No distress.  Skin:  Mild erythema of scalp noted in occipital region.  No scaling no open areas noted.           Assessment & Plan:

## 2013-04-21 NOTE — Assessment & Plan Note (Signed)
Appears stable at this time. Continue coal tar shampoo at least twice weekly.  We discussed that if this is no longer helping she could try selsun shampoo. Will refer to dermatology for overall skin examination at pt request.

## 2013-04-22 DIAGNOSIS — L821 Other seborrheic keratosis: Secondary | ICD-10-CM | POA: Diagnosis not present

## 2013-04-22 DIAGNOSIS — L219 Seborrheic dermatitis, unspecified: Secondary | ICD-10-CM | POA: Diagnosis not present

## 2013-04-22 DIAGNOSIS — L570 Actinic keratosis: Secondary | ICD-10-CM | POA: Diagnosis not present

## 2013-06-03 ENCOUNTER — Telehealth: Payer: Self-pay | Admitting: *Deleted

## 2013-06-03 NOTE — Telephone Encounter (Signed)
Pt is already aware of her appts on 06/23/13. im also mail a letter/avs...td

## 2013-06-22 ENCOUNTER — Other Ambulatory Visit: Payer: Self-pay | Admitting: Hematology and Oncology

## 2013-06-22 DIAGNOSIS — C9110 Chronic lymphocytic leukemia of B-cell type not having achieved remission: Secondary | ICD-10-CM

## 2013-06-23 ENCOUNTER — Encounter: Payer: Self-pay | Admitting: Hematology and Oncology

## 2013-06-23 ENCOUNTER — Encounter (INDEPENDENT_AMBULATORY_CARE_PROVIDER_SITE_OTHER): Payer: Self-pay

## 2013-06-23 ENCOUNTER — Ambulatory Visit (HOSPITAL_BASED_OUTPATIENT_CLINIC_OR_DEPARTMENT_OTHER): Payer: Medicare Other | Admitting: Hematology and Oncology

## 2013-06-23 ENCOUNTER — Other Ambulatory Visit (HOSPITAL_BASED_OUTPATIENT_CLINIC_OR_DEPARTMENT_OTHER): Payer: Medicare Other | Admitting: Lab

## 2013-06-23 VITALS — BP 160/73 | HR 94 | Temp 97.6°F | Resp 20 | Ht 62.0 in | Wt 130.5 lb

## 2013-06-23 DIAGNOSIS — C9110 Chronic lymphocytic leukemia of B-cell type not having achieved remission: Secondary | ICD-10-CM

## 2013-06-23 DIAGNOSIS — C9111 Chronic lymphocytic leukemia of B-cell type in remission: Secondary | ICD-10-CM | POA: Diagnosis not present

## 2013-06-23 DIAGNOSIS — Z23 Encounter for immunization: Secondary | ICD-10-CM | POA: Insufficient documentation

## 2013-06-23 DIAGNOSIS — D239 Other benign neoplasm of skin, unspecified: Secondary | ICD-10-CM

## 2013-06-23 LAB — CBC WITH DIFFERENTIAL/PLATELET
BASO%: 0.6 % (ref 0.0–2.0)
Eosinophils Absolute: 0.1 10*3/uL (ref 0.0–0.5)
HCT: 39.4 % (ref 34.8–46.6)
MCHC: 33.3 g/dL (ref 31.5–36.0)
MONO#: 0.4 10*3/uL (ref 0.1–0.9)
NEUT#: 4 10*3/uL (ref 1.5–6.5)
NEUT%: 27.7 % — ABNORMAL LOW (ref 38.4–76.8)
RBC: 4.32 10*6/uL (ref 3.70–5.45)
WBC: 14.5 10*3/uL — ABNORMAL HIGH (ref 3.9–10.3)
lymph#: 9.9 10*3/uL — ABNORMAL HIGH (ref 0.9–3.3)

## 2013-06-23 LAB — TECHNOLOGIST REVIEW

## 2013-06-23 MED ORDER — INFLUENZA VAC SPLIT QUAD 0.5 ML IM SUSP
0.5000 mL | INTRAMUSCULAR | Status: AC
  Administered 2013-06-23: 0.5 mL via INTRAMUSCULAR
  Filled 2013-06-23: qty 0.5

## 2013-06-23 NOTE — Progress Notes (Signed)
[facility] CONSULT NOTE CHIEF COMPLAINTS/PURPOSE OF CONSULTATION: CLL  HISTORY OF PRESENTING ILLNESS:  Alison Hammond 79 y.o. female is a lady who was diagnosed with CLL since 2005. The patient was placed on observation. Over the past few years, she denies any history of recurrent infection, abnormal weight loss, recent fevers, chills, night sweats. Her appetite is stable. The patient numerous skin lesions removed over the years but never been diagnosed with skin cancer or melanoma. A year ago, she was discovered to have a small ovarian cyst was removed at [facility] without complication.  MEDICAL HISTORY:  Past Medical History  Diagnosis Date  . Osteoporosis   . Chronic low back pain   . DDD (degenerative disc disease), lumbar   . Chronic lymphocytic leukemia   . Hx of adenomatous colonic polyps     no polyps in 1/08  . Hyperlipidemia   . Glaucoma   . Heart murmur   . History of blood transfusion     due to fracture of femur    SURGICAL HISTORY: Past Surgical History  Procedure Laterality Date  . Breast enhancement surgery  1978, 1980    with silicone  . Knee arthroscopy  1996    right  . Repair of crushed femur  1997  . Cataract extraction      right eye. Also has bilateral eye implants for distance and close up vision.  . Abdominal hysterectomy  2013    at [facility]    SOCIAL HISTORY: History   Social History  . Marital Status: Single    Spouse Name: N/A    Number of Children: 1  . Years of Education: N/A   Occupational History  .     Social History Main Topics  . Smoking status: Never Smoker   . Smokeless tobacco: Never Used  . Alcohol Use: 4.2 oz/week    7 Glasses of wine per week     Comment: 1 glass wine daily  . Drug Use: No  . Sexual Activity: Not on file   Other Topics Concern  . 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 Colon cancer Mother     Died from colon caner   . Esophageal cancer Father   . Arthritis Father   . Heart disease Father   . Fibromyalgia Daughter   . Stroke Maternal Grandmother     ALLERGIES:  has No Known Allergies.  MEDICATIONS: Current outpatient prescriptions:aspirin 81 MG tablet, Take 81 mg by mouth daily.  , Disp: , Rfl: ;  Calcium-Magnesium-Vitamin D (CALCIUM MAGNESIUM PO), Take 1 tablet by mouth daily. , Disp: , Rfl: ;  ergocalciferol (VITAMIN D2) 50000 UNITS capsule, Take 50,000 Units by mouth daily., Disp: , Rfl: ;  fexofenadine (ALLEGRA) 180 MG tablet, Take 180 mg by mouth daily., Disp: , Rfl:  fluticasone (FLONASE) 50 MCG/ACT nasal spray, Place 2 sprays into the nose daily., Disp: 16 g, Rfl: 3;  naproxen sodium (ANAPROX) 220 MG tablet, Take 220 mg by mouth daily., Disp: , Rfl: ;  Omega-3 Fatty Acids (FISH OIL) 1000 MG CAPS, Take 1 capsule by mouth daily. , Disp: ,  Rfl:  Current facility-administered medications:[START ON 06/24/2013] influenza vac split quadrivalent PF (FLUARIX) injection 0.5 mL, 0.5 mL, Intramuscular, Tomorrow-1000, Crimson Beer, MD  REVIEW OF SYSTEMS:   Constitutional: Denies fevers, chills or abnormal weight loss Eyes: Denies blurriness of vision, double vision or watery eyes Ears, nose, mouth, throat, and face: Denies mucositis or sore throat Respiratory: Denies cough, dyspnea or wheezes Cardiovascular: Denies palpitation, chest discomfort or lower extremity swelling Gastrointestinal:  Denies nausea, heartburn or change in bowel habits Skin: Denies abnormal skin rashes Lymphatics: Denies new lymphadenopathy or easy bruising Neurological:Denies numbness, tingling or new weaknesses Behavioral/Psych: Mood is stable, no new changes  All other systems were reviewed with the patient and  are negative.  PHYSICAL EXAMINATION: ECOG PERFORMANCE STATUS: 0 - Asymptomatic  Filed Vitals:   06/23/13 1411  BP: 160/73  Pulse: 94  Temp: 97.6 F (36.4 C)  Resp: 20   Filed Weights   06/23/13 1411  Weight: 130 lb 8 oz (59.194 kg)    GENERAL:alert, no distress and comfortable SKIN: Numerous moles were noted on her skin but nothing suspicious for melanoma EYES: normal, conjunctiva are pink and non-injected, sclera clear OROPHARYNX:no exudate, no erythema and lips, buccal mucosa, and tongue normal  NECK: supple, thyroid normal size, non-tender, without nodularity LYMPH:  no palpable lymphadenopathy in the cervical, axillary or inguinal LUNGS: clear to auscultation and percussion with normal breathing effort HEART: regular rate & rhythm and no murmurs and no lower extremity edema ABDOMEN:abdomen soft, non-tender and normal bowel sounds no splenomegaly Musculoskeletal:no cyanosis of digits and no clubbing  PSYCH: alert & oriented x 3 with fluent speech NEURO: no focal motor/sensory deficits  LABORATORY DATA:  I have reviewed the data as listed Recent Results (from the past 2160 hour(s))  CBC WITH DIFFERENTIAL     Status: Abnormal   Collection Time    06/23/13  1:49 PM      Result Value Range   WBC 14.5 (*) 3.9 - 10.3 10e3/uL   NEUT# 4.0  1.5 - 6.5 10e3/uL   HGB 13.1  11.6 - 15.9 g/dL   HCT 16.1  09.6 - 04.5 %   Platelets 229  145 - 400 10e3/uL   MCV 91.3  79.5 - 101.0 fL   MCH 30.4  25.1 - 34.0 pg   MCHC 33.3  31.5 - 36.0 g/dL   RBC 4.09  8.11 - 9.14 10e6/uL   RDW 13.9  11.2 - 14.5 %   lymph# 9.9 (*) 0.9 - 3.3 10e3/uL   MONO# 0.4  0.1 - 0.9 10e3/uL   Eosinophils Absolute 0.1  0.0 - 0.5 10e3/uL   Basophils Absolute 0.1  0.0 - 0.1 10e3/uL   NEUT% 27.7 (*) 38.4 - 76.8 %   LYMPH% 68.2 (*) 14.0 - 49.7 %   MONO% 2.9  0.0 - 14.0 %   EOS% 0.6  0.0 - 7.0 %   BASO% 0.6  0.0 - 2.0 %  TECHNOLOGIST REVIEW     Status: None   Collection Time    06/23/13  1:49 PM      Result  Value Range   Technologist Review Variant lymphs present, few smudge cells      ASSESSMENT:  Rai stage 0 CLL  PLAN:  #1 CLL Patient was diagnosed 9 years ago and has no evidence of progression. We will continue history, physical examination and blood work on a yearly basis from now on. The patient was educated about signs and symptoms to watch out for disease progression  and will call me in the range is in the appointment if any of the symptoms occur. #2 preventive care Recommend influenza vaccination for the patient today. She is in agreement to proceed #3 multiple moles I recommend she followup with her dermatologist on a regular basis for skin check due to high risk of skin cancer with CLL  All questions were answered. The patient knows to call the clinic with any problems, questions or concerns. I can certainly see the patient much sooner if necessary. No barriers to learning was detected.  I spent 15 minutes counseling the patient face to face. The total time spent in the appointment was 20 minutes and more than 50% was on counseling.     [facility], Ronie Fleeger, MD 06/23/2013 2:44 PM

## 2013-06-24 ENCOUNTER — Telehealth: Payer: Self-pay | Admitting: Hematology and Oncology

## 2013-06-24 NOTE — Telephone Encounter (Signed)
S/w the pt and she wants her oct 2015 appts to be mailed. Will mail the pt her appts.

## 2013-08-02 ENCOUNTER — Encounter: Payer: Self-pay | Admitting: Family

## 2013-08-02 ENCOUNTER — Ambulatory Visit (INDEPENDENT_AMBULATORY_CARE_PROVIDER_SITE_OTHER): Payer: Medicare Other | Admitting: Family

## 2013-08-02 VITALS — BP 130/70 | HR 79 | Temp 98.3°F | Resp 16 | Ht 62.0 in | Wt 131.1 lb

## 2013-08-02 DIAGNOSIS — R05 Cough: Secondary | ICD-10-CM | POA: Diagnosis not present

## 2013-08-02 MED ORDER — OMEPRAZOLE 40 MG PO CPDR: 40.0000 mg | DELAYED_RELEASE_CAPSULE | Freq: Every day | ORAL | Status: DC

## 2013-08-02 NOTE — Progress Notes (Signed)
Subjective:    Patient ID: Alison Hammond, female    DOB: 09-25-1924, 79 y.o.   MRN: [id number]  HPI  Alison Hammond is an 79 yr old female who presents today with chief complaint of cough. She reports that the symptoms started about 3 months ago then "let up."  She used Emergent "c".  Reports that her current cough can be wet at time and dry at others.  Denies associated fever or difficulty sleeping due to cough.  She denies associated sinus drainage. She reports that the cough is not severe.  Reports that her last CXR was 3-4 yrs ago.    She reports occasional GERD symptoms.  Symptoms are worsened with bending over.  She uses baking soda in water.    Review of Systems See HPI  Past Medical History  Diagnosis Date  . Osteoporosis   . Chronic low back pain   . DDD (degenerative disc disease), lumbar   . Chronic lymphocytic leukemia   . Hx of adenomatous colonic polyps     no polyps in 1/08  . Hyperlipidemia   . Glaucoma   . Heart murmur   . History of blood transfusion     due to fracture of femur    History   Social History  . Marital Status: Single    Spouse Name: N/A    Number of Children: 1  . Years of Education: N/A   Occupational History  .     Social History Main Topics  . Smoking status: Never Smoker   . Smokeless tobacco: Never Used  . Alcohol Use: 4.2 oz/week    7 Glasses of wine per week     Comment: 1 glass wine daily  . Drug Use: No  . Sexual Activity: Not on file   Other Topics Concern  . 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 Breast enhancement surgery  1978, 1980    with silicone  . Knee arthroscopy  1996    right  . Repair of crushed femur  1997  . Cataract extraction      right eye.  Also has bilateral eye implants for distance and close up vision.  . Abdominal hysterectomy  2013    at [facility]    Family History  Problem Relation Age of Onset  . Colon cancer Mother     Died from colon caner   . Esophageal cancer Father   . Arthritis Father   . Heart disease Father   . Fibromyalgia Daughter   . Stroke Maternal Grandmother     No Known Allergies  Current Outpatient Prescriptions on File Prior to Visit  Medication Sig Dispense Refill  . aspirin 81 MG tablet Take 81 mg by mouth daily.        . Calcium-Magnesium-Vitamin D (CALCIUM MAGNESIUM PO) Take 1 tablet by mouth daily.       . ergocalciferol (VITAMIN D2) 50000 UNITS capsule Take 50,000 Units by mouth daily.      . fexofenadine (ALLEGRA) 180 MG tablet Take 180 mg by mouth daily.      . fluticasone (FLONASE) 50 MCG/ACT nasal spray Place 2 sprays into  the nose daily.  16 g  3  . naproxen sodium (ANAPROX) 220 MG tablet Take 220 mg by mouth daily.      . Omega-3 Fatty Acids (FISH OIL) 1000 MG CAPS Take 1 capsule by mouth daily.        No current facility-administered medications on file prior to visit.    BP 130/70  Pulse 79  Temp(Src) 98.3 F (36.8 C) (Oral)  Resp 16  Ht 5\' 2"  (1.575 m)  Wt 131 lb 1.3 oz (59.457 kg)  BMI 23.97 kg/m2  SpO2 98%       Objective:   Physical Exam  Constitutional: She is oriented to person, place, and time. She appears well-developed and well-nourished. No distress.  HENT:  Head: Normocephalic and atraumatic.  Right Ear: Tympanic membrane and ear canal normal.  Left Ear: Tympanic membrane and ear canal normal.  Mouth/Throat: No oropharyngeal exudate or posterior oropharyngeal edema.  Eyes: No scleral icterus.  Cardiovascular: Normal rate and regular rhythm.   No murmur heard. Pulmonary/Chest: Effort normal and breath sounds normal. No respiratory distress. She has no wheezes. She has no rales. She exhibits no tenderness.  Musculoskeletal: She exhibits no edema.   Neurological: She is alert and oriented to person, place, and time.  Psychiatric: She has a normal mood and affect. Her behavior is normal. Judgment and thought content normal.          Assessment & Plan:

## 2013-08-02 NOTE — Patient Instructions (Signed)
Please complete your chest x ray on the first floor. Start omeprazole.   Call if symptoms worsen or if symptoms do not improve.  Follow up in 1 month.

## 2013-08-02 NOTE — Assessment & Plan Note (Addendum)
Doubt infectious etiology.  Pt would like CXR which is reasonable.  I suspect GERD contributing.  Trial of omeprazole 40mg .

## 2013-08-05 ENCOUNTER — Telehealth: Payer: Self-pay | Admitting: *Deleted

## 2013-08-05 NOTE — Telephone Encounter (Signed)
Received call from pt returning call from someone regarding an order.  Upon review of EPIC it appears that pt was supposed to have a chest xray performed. She states she will return to radiology tomorrow to complete it. Notified radiology.

## 2013-10-22 DIAGNOSIS — G4700 Insomnia, unspecified: Secondary | ICD-10-CM | POA: Diagnosis not present

## 2013-11-05 ENCOUNTER — Other Ambulatory Visit: Payer: Medicare Other | Admitting: Lab

## 2013-11-05 ENCOUNTER — Ambulatory Visit: Payer: Medicare Other

## 2014-02-16 DIAGNOSIS — M25519 Pain in unspecified shoulder: Secondary | ICD-10-CM | POA: Diagnosis not present

## 2014-02-16 DIAGNOSIS — S20219A Contusion of unspecified front wall of thorax, initial encounter: Secondary | ICD-10-CM | POA: Diagnosis not present

## 2014-02-16 DIAGNOSIS — S46909A Unspecified injury of unspecified muscle, fascia and tendon at shoulder and upper arm level, unspecified arm, initial encounter: Secondary | ICD-10-CM | POA: Diagnosis not present

## 2014-02-16 DIAGNOSIS — R079 Chest pain, unspecified: Secondary | ICD-10-CM | POA: Diagnosis not present

## 2014-02-16 DIAGNOSIS — S4980XA Other specified injuries of shoulder and upper arm, unspecified arm, initial encounter: Secondary | ICD-10-CM | POA: Diagnosis not present

## 2014-02-16 DIAGNOSIS — S298XXA Other specified injuries of thorax, initial encounter: Secondary | ICD-10-CM | POA: Diagnosis not present

## 2014-02-24 DIAGNOSIS — I6529 Occlusion and stenosis of unspecified carotid artery: Secondary | ICD-10-CM | POA: Diagnosis not present

## 2014-02-24 DIAGNOSIS — Z136 Encounter for screening for cardiovascular disorders: Secondary | ICD-10-CM | POA: Diagnosis not present

## 2014-02-24 DIAGNOSIS — C9110 Chronic lymphocytic leukemia of B-cell type not having achieved remission: Secondary | ICD-10-CM | POA: Diagnosis not present

## 2014-02-24 DIAGNOSIS — M810 Age-related osteoporosis without current pathological fracture: Secondary | ICD-10-CM | POA: Diagnosis not present

## 2014-02-24 DIAGNOSIS — H9190 Unspecified hearing loss, unspecified ear: Secondary | ICD-10-CM | POA: Diagnosis not present

## 2014-02-24 DIAGNOSIS — Z0000 Encounter for general adult medical examination without abnormal findings: Secondary | ICD-10-CM | POA: Diagnosis not present

## 2014-02-24 DIAGNOSIS — R05 Cough: Secondary | ICD-10-CM | POA: Diagnosis not present

## 2014-02-24 DIAGNOSIS — Z1231 Encounter for screening mammogram for malignant neoplasm of breast: Secondary | ICD-10-CM | POA: Diagnosis not present

## 2014-02-24 DIAGNOSIS — R059 Cough, unspecified: Secondary | ICD-10-CM | POA: Diagnosis not present

## 2014-02-25 DIAGNOSIS — C9110 Chronic lymphocytic leukemia of B-cell type not having achieved remission: Secondary | ICD-10-CM | POA: Diagnosis not present

## 2014-02-25 DIAGNOSIS — R82998 Other abnormal findings in urine: Secondary | ICD-10-CM | POA: Diagnosis not present

## 2014-02-25 DIAGNOSIS — I6529 Occlusion and stenosis of unspecified carotid artery: Secondary | ICD-10-CM | POA: Diagnosis not present

## 2014-02-25 DIAGNOSIS — M810 Age-related osteoporosis without current pathological fracture: Secondary | ICD-10-CM | POA: Diagnosis not present

## 2014-02-25 DIAGNOSIS — M549 Dorsalgia, unspecified: Secondary | ICD-10-CM | POA: Diagnosis not present

## 2014-03-04 DIAGNOSIS — J309 Allergic rhinitis, unspecified: Secondary | ICD-10-CM | POA: Diagnosis not present

## 2014-04-11 ENCOUNTER — Ambulatory Visit (INDEPENDENT_AMBULATORY_CARE_PROVIDER_SITE_OTHER): Payer: Medicare Other | Admitting: Family

## 2014-04-11 ENCOUNTER — Other Ambulatory Visit: Payer: Self-pay | Admitting: Family

## 2014-04-11 ENCOUNTER — Encounter: Payer: Self-pay | Admitting: Family

## 2014-04-11 VITALS — BP 100/70 | HR 86 | Temp 97.6°F | Resp 18 | Ht 62.0 in | Wt 124.0 lb

## 2014-04-11 DIAGNOSIS — R3129 Other microscopic hematuria: Secondary | ICD-10-CM | POA: Diagnosis not present

## 2014-04-11 DIAGNOSIS — R82998 Other abnormal findings in urine: Secondary | ICD-10-CM

## 2014-04-11 DIAGNOSIS — E559 Vitamin D deficiency, unspecified: Secondary | ICD-10-CM

## 2014-04-11 DIAGNOSIS — C9110 Chronic lymphocytic leukemia of B-cell type not having achieved remission: Secondary | ICD-10-CM

## 2014-04-11 LAB — POCT URINALYSIS DIPSTICK
Bilirubin, UA: NEGATIVE
Glucose, UA: NEGATIVE
Ketones, UA: NEGATIVE
NITRITE UA: POSITIVE
PH UA: 6
Spec Grav, UA: 1.025
UROBILINOGEN UA: 0.2

## 2014-04-11 MED ORDER — CIPROFLOXACIN HCL 250 MG PO TABS: 250.0000 mg | ORAL_TABLET | Freq: Two times a day (BID) | ORAL | Status: DC

## 2014-04-11 NOTE — Progress Notes (Signed)
Pre visit review using our clinic review tool, if applicable. No additional management support is needed unless otherwise documented below in the visit note. 

## 2014-04-11 NOTE — Progress Notes (Signed)
Subjective:    Patient ID: Alison Hammond, female    DOB: 06-27-1925, 80 y.o.   MRN: [id number]  HPI   Alison Hammond is a 80 year old female here today for follow up. She had a physical at the [facility] in [place] which included  a UA in June 2015 that showed RBCs and WBCs but culture was negative. She was not symptomatic   Chronic lymphocytic leukemia - follows with oncologist every 6 months. Reports no sx or problems from leukemia. Last WBC was 14.6  Carotid stenosis.  Had carotid duplex at the [facility] clinic which noted mild to moderate stenosis <40%. She is maintained on asprin 81mg .  LDL was 99.    Vit D deficiency- last vit D level was low at 22.   She reports that she is continuing vit D supplement.     Review of Systems  Respiratory: Negative for cough.   Gastrointestinal: Positive for diarrhea (Drinks prune juice for this.). Negative for nausea, vomiting and constipation.  Genitourinary: Negative for dysuria, urgency, frequency and hematuria.   Past Medical History  Diagnosis Date  . Osteoporosis   . Chronic low back pain   . DDD (degenerative disc disease), lumbar   . Chronic lymphocytic leukemia   . Hx of adenomatous colonic polyps     no polyps in 1/08  . Hyperlipidemia   . Glaucoma   . Heart murmur   . History of blood transfusion     due to fracture of femur    History   Social History  . Marital Status: Single    Spouse Name: N/A    Number of Children: 1  . Years of Education: N/A   Occupational History  .     Social History Main Topics  . Smoking status: Never Smoker   . Smokeless tobacco: Never Used  . Alcohol Use: 4.2 oz/week    7 Glasses of wine per week     Comment: 1 glass wine daily  . Drug Use: No  . Sexual Activity: Not on file   Other Topics Concern  . 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 Breast enhancement surgery  1978, 1443    with silicone  . Knee arthroscopy  1996    right  . Repair of crushed femur  1997  . Cataract extraction      right eye. Also has bilateral eye implants for distance and close up vision.  . Abdominal hysterectomy  2013    at [place] History  Problem Relation Age of Onset  . Colon cancer Mother     Died from colon caner   . Esophageal cancer Father   . Arthritis Father   . Heart disease Father   . Fibromyalgia Daughter   . Stroke Maternal Grandmother     No Known Allergies  Current Outpatient Prescriptions on File Prior to Visit  Medication Sig Dispense Refill  . aspirin 81 MG tablet Take 81 mg by mouth daily.        . Calcium-Magnesium-Vitamin D (CALCIUM MAGNESIUM PO) Take 1 tablet by mouth daily.       Marland Kitchen  ergocalciferol (VITAMIN D2) 50000 UNITS capsule Take 50,000 Units by mouth daily.      . fluticasone (FLONASE) 50 MCG/ACT nasal spray Place 2 sprays into the nose daily.  16 g  3  . latanoprost (XALATAN) 0.005 % ophthalmic solution Place 1 drop into the right eye daily.      . naproxen sodium (ANAPROX) 220 MG tablet Take 220 mg by mouth daily.      . Omega-3 Fatty Acids (FISH OIL) 1000 MG CAPS Take 1 capsule by mouth daily.       Marland Kitchen zolpidem (AMBIEN CR) 6.25 MG CR tablet Take 6.25 mg by mouth at bedtime as needed.       No current facility-administered medications on file prior to visit.    BP 100/70  Pulse 86  Temp(Src) 97.6 F (36.4 C) (Oral)  Resp 18  Ht 5\' 2"  (1.575 m)  Wt 124 lb (56.246 kg)  BMI 22.67 kg/m2  SpO2 98%        Objective:   Physical Exam  Constitutional: She is oriented to person, place, and time. She appears well-developed. No distress.  Thin.  HENT:  Head: Normocephalic and atraumatic.  Mouth/Throat: No oropharyngeal exudate.  Eyes: Pupils are equal,  round, and reactive to light.  Cardiovascular: Normal rate, regular rhythm and normal heart sounds.   Pulmonary/Chest: Effort normal and breath sounds normal. No respiratory distress. She has no wheezes. She has no rales.  Genitourinary:  No CVA tenderness.  Lymphadenopathy:    She has no cervical adenopathy.  Neurological: She is alert and oriented to person, place, and time.  Skin: Skin is warm and dry. She is not diaphoretic.  Psychiatric: She has a normal mood and affect. Her behavior is normal. Judgment and thought content normal.          Assessment & Plan:  Patient seen by [facility] NP-student.  I have personally seen and examined patient and agree with Ms. Whitmire's assessment and plan.

## 2014-04-11 NOTE — Assessment & Plan Note (Signed)
Clinically stable, management per hematology.  

## 2014-04-11 NOTE — Patient Instructions (Signed)
Please start cipro. Return to lab in 1 month for urinalyisis.

## 2014-04-11 NOTE — Telephone Encounter (Signed)
Please advise pt that I reviewed the vit d level from her most recent physical.  I believe she is only taking 2000 units of vit d once daily.  If so, she should stop this and start the 50000 units once weekly with plan to repeat vit d level in 3 months.  Dx vit d def.

## 2014-04-11 NOTE — Assessment & Plan Note (Signed)
D/c daily 2000 units, start weekly 50000 units. Repeat vit d in 3 months. See phone note.

## 2014-04-12 LAB — URINE CULTURE

## 2014-04-12 NOTE — Telephone Encounter (Signed)
Left detailed message re: below instructions and to call to schedule lab appt in 3 months.

## 2014-04-15 MED ORDER — ERGOCALCIFEROL 1.25 MG (50000 UT) PO CAPS: 50000.0000 [IU] | ORAL_CAPSULE | ORAL | Status: DC

## 2014-04-15 NOTE — Telephone Encounter (Signed)
Notified pt and sent rx. Pt states she will call back to schedule lab appt. Lab order entered.

## 2014-05-10 ENCOUNTER — Other Ambulatory Visit: Payer: Self-pay | Admitting: Family

## 2014-05-10 DIAGNOSIS — R3129 Other microscopic hematuria: Secondary | ICD-10-CM | POA: Diagnosis not present

## 2014-05-11 LAB — URINE CULTURE
Colony Count: NO GROWTH
Organism ID, Bacteria: NO GROWTH

## 2014-05-16 ENCOUNTER — Encounter: Payer: Self-pay | Admitting: Family

## 2014-05-16 ENCOUNTER — Ambulatory Visit (INDEPENDENT_AMBULATORY_CARE_PROVIDER_SITE_OTHER): Payer: Medicare Other | Admitting: Family

## 2014-05-16 VITALS — BP 120/78 | HR 80 | Resp 16 | Ht 62.0 in | Wt 123.6 lb

## 2014-05-16 DIAGNOSIS — E559 Vitamin D deficiency, unspecified: Secondary | ICD-10-CM

## 2014-05-16 DIAGNOSIS — J309 Allergic rhinitis, unspecified: Secondary | ICD-10-CM | POA: Diagnosis not present

## 2014-05-16 DIAGNOSIS — Z23 Encounter for immunization: Secondary | ICD-10-CM

## 2014-05-16 LAB — VITAMIN D 25 HYDROXY (VIT D DEFICIENCY, FRACTURES): VITD: 42.75 ng/mL (ref 30.00–100.00)

## 2014-05-16 MED ORDER — LATANOPROST 0.005 % OP SOLN: 1.0000 [drp] | Freq: Every day | OPHTHALMIC | Status: AC

## 2014-05-16 MED ORDER — ZOLPIDEM TARTRATE ER 6.25 MG PO TBCR: 6.2500 mg | EXTENDED_RELEASE_TABLET | Freq: Every evening | ORAL | Status: DC | PRN

## 2014-05-16 NOTE — Patient Instructions (Signed)
Please complete lab work prior to leaving. Make sure you are taking a 24 hour antihistamine such as zyrtec, allegra or claritin along with your flonase for your nasal congestion. Call if nasal congestion worsens, or if you develop fever.   Follow up in 6 months, sooner if problems/concerns.

## 2014-05-16 NOTE — Assessment & Plan Note (Signed)
Will check follow up vit d level.

## 2014-05-16 NOTE — Assessment & Plan Note (Signed)
Continue flonase and add a 24 hour antihistamine. She is not sure what she is taking. I recommended claritin, zyrtec or allegra.

## 2014-05-16 NOTE — Progress Notes (Signed)
Subjective:    Patient ID: Alison Hammond, female    DOB: [birth year], 80 y.o.   MRN: [id number]  HPI  [name] is an 80 yr old female who presents today with chief complaint of nasal congestion. Pt reports that her congestion is sporadic in nature.  Symptoms are mild.  She does report some phlegm.  She uses flonase and an otc antihistamine "pink pill." she denies otalgia.  Vit D deficiency- reports that she takes weekly tabs.     Review of Systems See HPI  Past Medical History  Diagnosis Date  . Osteoporosis   . Chronic low back pain   . DDD (degenerative disc disease), lumbar   . Chronic lymphocytic leukemia   . Hx of adenomatous colonic polyps     no polyps in 1/08  . Hyperlipidemia   . Glaucoma   . Heart murmur   . History of blood transfusion     due to fracture of femur    History   Social History  . Marital Status: Single    Spouse Name: N/A    Number of Children: 1  . Years of Education: N/A   Occupational History  .     Social History Main Topics  . Smoking status: Never Smoker   . Smokeless tobacco: Never Used  . Alcohol Use: 4.2 oz/week    7 Glasses of wine per week     Comment: 1 glass wine daily  . Drug Use: No  . Sexual Activity: Not on file   Other Topics Concern  . 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 Breast enhancement surgery  1978, 2542    with silicone  . Knee arthroscopy  1996    right  . Repair of crushed femur  1997  . Cataract extraction      right eye. Also has bilateral eye implants for distance and close up vision.  . Abdominal hysterectomy  2013    at [place] History  Problem Relation Age of Onset  . Colon cancer Mother     Died from colon  caner   . Esophageal cancer Father   . Arthritis Father   . Heart disease Father   . Fibromyalgia Daughter   . Stroke Maternal Grandmother     No Known Allergies  Current Outpatient Prescriptions on File Prior to Visit  Medication Sig Dispense Refill  . aspirin 81 MG tablet Take 81 mg by mouth daily.        . Calcium-Magnesium-Vitamin D (CALCIUM MAGNESIUM PO) Take 1 tablet by mouth daily.       . Coenzyme Q10 (COQ10 PO) Take 2 tablets by mouth daily.      . ergocalciferol (VITAMIN D2) 50000 UNITS capsule Take 1 capsule (50,000 Units total) by mouth once a week.  4 capsule  3  . fluticasone (FLONASE) 50 MCG/ACT nasal spray Place 2 sprays into the nose daily.  16 g  3  . latanoprost (XALATAN) 0.005 % ophthalmic solution Place 1 drop into the right eye daily.      . naproxen sodium (ANAPROX) 220 MG tablet Take 220 mg  by mouth daily.      . Omega-3 Fatty Acids (FISH OIL) 1000 MG CAPS Take 1 capsule by mouth daily.       Marland Kitchen zolpidem (AMBIEN CR) 6.25 MG CR tablet Take 6.25 mg by mouth at bedtime as needed.       No current facility-administered medications on file prior to visit.    BP 120/78  Pulse 80  Resp 16  Ht 5\' 2"  (1.575 m)  Wt 123 lb 9.6 oz (56.065 kg)  BMI 22.60 kg/m2  SpO2 99%       Objective:   Physical Exam  Constitutional: She is oriented to person, place, and time. She appears well-developed and well-nourished. No distress.  HENT:  Head: Normocephalic and atraumatic.  Right Ear: Ear canal normal.  Left Ear: Tympanic membrane and ear canal normal.  R TM is pink and non-bulging.   Cardiovascular: Normal rate and regular rhythm.   No murmur heard. Pulmonary/Chest: Effort normal and breath sounds normal. No respiratory distress. She has no wheezes. She has no rales. She exhibits no tenderness.  Neurological: She is alert and oriented to person, place, and time.  Psychiatric: She has a normal mood and affect. Her behavior is normal. Judgment and thought content  normal.          Assessment & Plan:

## 2014-05-17 ENCOUNTER — Encounter: Payer: Self-pay | Admitting: Family

## 2014-06-24 ENCOUNTER — Other Ambulatory Visit: Payer: Self-pay | Admitting: Hematology and Oncology

## 2014-06-27 ENCOUNTER — Other Ambulatory Visit: Payer: Self-pay | Admitting: Hematology and Oncology

## 2014-06-27 ENCOUNTER — Other Ambulatory Visit (HOSPITAL_BASED_OUTPATIENT_CLINIC_OR_DEPARTMENT_OTHER): Payer: Medicare Other

## 2014-06-27 ENCOUNTER — Ambulatory Visit: Payer: Medicare Other

## 2014-06-27 ENCOUNTER — Encounter: Payer: Self-pay | Admitting: Hematology and Oncology

## 2014-06-27 ENCOUNTER — Ambulatory Visit (HOSPITAL_BASED_OUTPATIENT_CLINIC_OR_DEPARTMENT_OTHER): Payer: Medicare Other | Admitting: Hematology and Oncology

## 2014-06-27 VITALS — BP 146/75 | HR 81 | Temp 97.9°F | Resp 18 | Ht 62.0 in | Wt >= 6400 oz

## 2014-06-27 DIAGNOSIS — C9100 Acute lymphoblastic leukemia not having achieved remission: Secondary | ICD-10-CM

## 2014-06-27 DIAGNOSIS — C9110 Chronic lymphocytic leukemia of B-cell type not having achieved remission: Secondary | ICD-10-CM

## 2014-06-27 DIAGNOSIS — G4700 Insomnia, unspecified: Secondary | ICD-10-CM

## 2014-06-27 HISTORY — DX: Insomnia, unspecified: G47.00

## 2014-06-27 LAB — COMPREHENSIVE METABOLIC PANEL (CC13)
ALBUMIN: 3.9 g/dL (ref 3.5–5.0)
ALT: 14 U/L (ref 0–55)
ANION GAP: 9 meq/L (ref 3–11)
AST: 18 U/L (ref 5–34)
Alkaline Phosphatase: 60 U/L (ref 40–150)
BILIRUBIN TOTAL: 0.58 mg/dL (ref 0.20–1.20)
BUN: 23.7 mg/dL (ref 7.0–26.0)
CO2: 27 meq/L (ref 22–29)
Calcium: 9.8 mg/dL (ref 8.4–10.4)
Chloride: 104 mEq/L (ref 98–109)
Creatinine: 1 mg/dL (ref 0.6–1.1)
GLUCOSE: 125 mg/dL (ref 70–140)
Potassium: 4.4 mEq/L (ref 3.5–5.1)
SODIUM: 140 meq/L (ref 136–145)
TOTAL PROTEIN: 6.5 g/dL (ref 6.4–8.3)

## 2014-06-27 LAB — CBC WITH DIFFERENTIAL/PLATELET
BASO%: 0.7 % (ref 0.0–2.0)
BASOS ABS: 0.1 10*3/uL (ref 0.0–0.1)
EOS ABS: 0 10*3/uL (ref 0.0–0.5)
EOS%: 0.3 % (ref 0.0–7.0)
HEMATOCRIT: 39.7 % (ref 34.8–46.6)
HGB: 12.7 g/dL (ref 11.6–15.9)
LYMPH%: 66.4 % — AB (ref 14.0–49.7)
MCH: 29.3 pg (ref 25.1–34.0)
MCHC: 32.1 g/dL (ref 31.5–36.0)
MCV: 91.4 fL (ref 79.5–101.0)
MONO#: 0.6 10*3/uL (ref 0.1–0.9)
MONO%: 4.9 % (ref 0.0–14.0)
NEUT%: 27.7 % — ABNORMAL LOW (ref 38.4–76.8)
NEUTROS ABS: 3.6 10*3/uL (ref 1.5–6.5)
Platelets: 237 10*3/uL (ref 145–400)
RBC: 4.34 10*6/uL (ref 3.70–5.45)
RDW: 13.9 % (ref 11.2–14.5)
WBC: 12.9 10*3/uL — ABNORMAL HIGH (ref 3.9–10.3)
lymph#: 8.6 10*3/uL — ABNORMAL HIGH (ref 0.9–3.3)

## 2014-06-27 LAB — TECHNOLOGIST REVIEW

## 2014-06-27 LAB — LACTATE DEHYDROGENASE (CC13): LDH: 165 U/L (ref 125–245)

## 2014-06-27 MED ORDER — ZOLPIDEM TARTRATE ER 6.25 MG PO TBCR: 6.2500 mg | EXTENDED_RELEASE_TABLET | Freq: Every evening | ORAL | Status: DC | PRN

## 2014-06-27 NOTE — Assessment & Plan Note (Signed)
I refilled her prescription one time only. I recommend she follows with primary care provider for future medication refill.

## 2014-06-27 NOTE — Progress Notes (Signed)
[facility] OFFICE PROGRESS NOTE  Patient Care Team: Debbrah Alar, NP as PCP - General (Internal Medicine) Jeanie Cooks, MD as Consulting Physician (Hematology and Oncology)  SUMMARY OF ONCOLOGIC HISTORY: She was diagnosed with CLL since 2005. The patient was placed on observation. Over the past few years, she denies any history of recurrent infection, abnormal weight loss, recent fevers, chills, night sweats. Her appetite is stable. The patient numerous skin lesions removed over the years but never been diagnosed with skin cancer or melanoma. A year ago, she was discovered to have a small ovarian cyst was removed at [facility] without complication.  INTERVAL HISTORY: Please see below for problem oriented charting. She denies any recent fever, chills, night sweats or abnormal weight loss She denies new skin lesion. No recent infection.  REVIEW OF SYSTEMS:   Constitutional: Denies fevers, chills or abnormal weight loss Eyes: Denies blurriness of vision Ears, nose, mouth, throat, and face: Denies mucositis or sore throat Respiratory: Denies cough, dyspnea or wheezes Cardiovascular: Denies palpitation, chest discomfort or lower extremity swelling Gastrointestinal:  Denies nausea, heartburn or change in bowel habits Skin: Denies abnormal skin rashes Lymphatics: Denies new lymphadenopathy or easy bruising Neurological:Denies numbness, tingling or new weaknesses Behavioral/Psych: Mood is stable, no new changes  All other systems were reviewed with the patient and are negative.  I have reviewed the past medical history, past surgical history, social history and family history with the patient and they are unchanged from previous note.  ALLERGIES:  has No Known Allergies.  MEDICATIONS:  Current Outpatient Prescriptions  Medication Sig Dispense Refill  . aspirin 81 MG tablet Take 81 mg by mouth daily.        . Calcium-Magnesium-Vitamin D (CALCIUM MAGNESIUM PO)  Take 1 tablet by mouth daily.       . Coenzyme Q10 (COQ10 PO) Take 2 tablets by mouth daily.      . ergocalciferol (VITAMIN D2) 50000 UNITS capsule Take 1 capsule (50,000 Units total) by mouth once a week.  4 capsule  3  . fluticasone (FLONASE) 50 MCG/ACT nasal spray Place 2 sprays into the nose daily.  16 g  3  . latanoprost (XALATAN) 0.005 % ophthalmic solution Place 1 drop into the right eye daily.  2.5 mL  2  . naproxen sodium (ANAPROX) 220 MG tablet Take 220 mg by mouth daily.      . Omega-3 Fatty Acids (FISH OIL) 1000 MG CAPS Take 1 capsule by mouth daily.       Marland Kitchen zolpidem (AMBIEN CR) 6.25 MG CR tablet Take 1 tablet (6.25 mg total) by mouth at bedtime as needed.  30 tablet  0   No current facility-administered medications for this visit.    PHYSICAL EXAMINATION: ECOG PERFORMANCE STATUS: 0 - Asymptomatic  Filed Vitals:   06/27/14 1321  BP: 146/75  Pulse: 81  Temp: 97.9 F (36.6 C)  Resp: 18   Filed Weights   06/27/14 1321  Weight: 1234 lb (559.739 kg)    GENERAL:alert, no distress and comfortable. She looks thin and frail SKIN: skin color, texture, turgor are normal, no rashes or significant lesions EYES: normal, Conjunctiva are pink and non-injected, sclera clear OROPHARYNX:no exudate, no erythema and lips, buccal mucosa, and tongue normal  NECK: supple, thyroid normal size, non-tender, without nodularity LYMPH:  no palpable lymphadenopathy in the cervical, axillary or inguinal LUNGS: clear to auscultation and percussion with normal breathing effort HEART: regular rate & rhythm and no murmurs and no lower  extremity edema ABDOMEN:abdomen soft, non-tender and normal bowel sounds Musculoskeletal:no cyanosis of digits and no clubbing  NEURO: alert & oriented x 3 with fluent speech, no focal motor/sensory deficits  LABORATORY DATA:  I have reviewed the data as listed    Component Value Date/Time   NA 140 06/27/2014 1308   NA 140 08/18/2012 0925   K 4.4 06/27/2014 1308    K 4.8 08/18/2012 0925   CL 101 11/06/2012 0806   CL 99 08/18/2012 0925   CO2 27 06/27/2014 1308   CO2 26 08/18/2012 0925   GLUCOSE 125 06/27/2014 1308   GLUCOSE 151* 11/06/2012 0806   GLUCOSE 100* 08/18/2012 0925   BUN 23.7 06/27/2014 1308   BUN 20 08/18/2012 0925   CREATININE 1.0 06/27/2014 1308   CREATININE 0.81 08/18/2012 0925   CREATININE 0.89 11/07/2011 1107   CALCIUM 9.8 06/27/2014 1308   CALCIUM 9.2 08/18/2012 0925   PROT 6.5 06/27/2014 1308   PROT 6.0 11/07/2011 1107   ALBUMIN 3.9 06/27/2014 1308   ALBUMIN 3.9 11/07/2011 1107   AST 18 06/27/2014 1308   AST 20 11/07/2011 1107   ALT 14 06/27/2014 1308   ALT 17 11/07/2011 1107   ALKPHOS 60 06/27/2014 1308   ALKPHOS 46 11/07/2011 1107   BILITOT 0.58 06/27/2014 1308   BILITOT 0.6 11/07/2011 1107   GFRNONAA >60 06/24/2009 1140   GFRAA  Value: >60        The eGFR has been calculated using the MDRD equation. This calculation has not been validated in all clinical situations. eGFR's persistently <60 mL/min signify possible Chronic Kidney Disease. 06/24/2009 1140    No results found for this basename: SPEP,  UPEP,   kappa and lambda light chains    Lab Results  Component Value Date   WBC 12.9* 06/27/2014   NEUTROABS 3.6 06/27/2014   HGB 12.7 06/27/2014   HCT 39.7 06/27/2014   MCV 91.4 06/27/2014   PLT 237 06/27/2014      Chemistry      Component Value Date/Time   NA 140 06/27/2014 1308   NA 140 08/18/2012 0925   K 4.4 06/27/2014 1308   K 4.8 08/18/2012 0925   CL 101 11/06/2012 0806   CL 99 08/18/2012 0925   CO2 27 06/27/2014 1308   CO2 26 08/18/2012 0925   BUN 23.7 06/27/2014 1308   BUN 20 08/18/2012 0925   CREATININE 1.0 06/27/2014 1308   CREATININE 0.81 08/18/2012 0925   CREATININE 0.89 11/07/2011 1107      Component Value Date/Time   CALCIUM 9.8 06/27/2014 1308   CALCIUM 9.2 08/18/2012 0925   ALKPHOS 60 06/27/2014 1308   ALKPHOS 46 11/07/2011 1107   AST 18 06/27/2014 1308   AST 20 11/07/2011 1107   ALT 14 06/27/2014 1308    ALT 17 11/07/2011 1107   BILITOT 0.58 06/27/2014 1308   BILITOT 0.6 11/07/2011 1107      ASSESSMENT & PLAN:  Chronic lymphocytic leukemia Clinically, she has no signs of disease progression. I will see her on a yearly basis with history, physical examination and blood work.   Insomnia I refilled her prescription one time only. I recommend she follows with primary care provider for future medication refill.     All questions were answered. The patient knows to call the clinic with any problems, questions or concerns. No barriers to learning was detected. I spent 15 minutes counseling the patient face to face. The total time spent in the appointment was 20 minutes  and more than 50% was on counseling and review of test results     [facility], Margalit Leece, MD 06/27/2014 1:50 PM

## 2014-06-27 NOTE — Assessment & Plan Note (Signed)
Clinically, she has no signs of disease progression. I will see her on a yearly basis with history, physical examination and blood work.

## 2014-06-28 ENCOUNTER — Telehealth: Payer: Self-pay | Admitting: Hematology and Oncology

## 2014-06-28 NOTE — Telephone Encounter (Signed)
lvm for pt regarding to OCT 2016 appts....mailed pt appt sched/avs and letter

## 2014-07-18 ENCOUNTER — Encounter: Payer: Self-pay | Admitting: Hematology and Oncology

## 2014-07-29 DIAGNOSIS — L820 Inflamed seborrheic keratosis: Secondary | ICD-10-CM | POA: Diagnosis not present

## 2014-07-29 DIAGNOSIS — L570 Actinic keratosis: Secondary | ICD-10-CM | POA: Diagnosis not present

## 2014-08-05 ENCOUNTER — Encounter: Payer: Self-pay | Admitting: Family

## 2014-08-05 ENCOUNTER — Ambulatory Visit (INDEPENDENT_AMBULATORY_CARE_PROVIDER_SITE_OTHER): Payer: Medicare Other | Admitting: Family

## 2014-08-05 VITALS — BP 136/70 | HR 83 | Temp 97.6°F | Resp 16 | Ht 62.0 in | Wt 124.0 lb

## 2014-08-05 DIAGNOSIS — E559 Vitamin D deficiency, unspecified: Secondary | ICD-10-CM

## 2014-08-05 DIAGNOSIS — J209 Acute bronchitis, unspecified: Secondary | ICD-10-CM | POA: Diagnosis not present

## 2014-08-05 DIAGNOSIS — G4700 Insomnia, unspecified: Secondary | ICD-10-CM

## 2014-08-05 MED ORDER — CEFUROXIME AXETIL 500 MG PO TABS: 500.0000 mg | ORAL_TABLET | Freq: Two times a day (BID) | ORAL | Status: DC

## 2014-08-05 MED ORDER — VITAMIN D 1000 UNITS PO TABS: 3000.0000 [IU] | ORAL_TABLET | Freq: Every day | ORAL | Status: DC

## 2014-08-05 MED ORDER — ZOLPIDEM TARTRATE ER 6.25 MG PO TBCR: 6.2500 mg | EXTENDED_RELEASE_TABLET | Freq: Every evening | ORAL | Status: DC | PRN

## 2014-08-05 MED ORDER — BENZONATATE 100 MG PO CAPS: 100.0000 mg | ORAL_CAPSULE | Freq: Three times a day (TID) | ORAL | Status: DC | PRN

## 2014-08-05 NOTE — Progress Notes (Signed)
Subjective:    Patient ID: Alison Hammond, female    DOB: Jul 12, 1925, 80 y.o.   MRN: [id number]  HPI   Alison Hammond is an 80 year old female who presents today with chief complaint of cough.  Has been present x 1 week. Has taken "emergen-C."  Reports associates nasal drainage (clear).  Denies known fever- but does no have a thermometer. Feels like her symptoms are worsening. Reports energy is OK.      Review of Systems See HPI  Past Medical History  Diagnosis Date  . Osteoporosis   . Chronic low back pain   . DDD (degenerative disc disease), lumbar   . Chronic lymphocytic leukemia   . Hx of adenomatous colonic polyps     no polyps in 1/08  . Hyperlipidemia   . Glaucoma   . Heart murmur   . History of blood transfusion     due to fracture of femur  . Insomnia 06/27/2014    History   Social History  . Marital Status: Single    Spouse Name: N/A    Number of Children: 1  . Years of Education: N/A   Occupational History  .     Social History Main Topics  . Smoking status: Never Smoker   . Smokeless tobacco: Never Used  . Alcohol Use: 4.2 oz/week    7 Glasses of wine per week     Comment: 1 glass wine daily  . Drug Use: No  . Sexual Activity: Not on file   Other Topics Concern  . 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 Breast enhancement surgery  1978, 4098    with silicone  . Knee arthroscopy  1996    right  . Repair of crushed femur  1997  . Cataract extraction      right eye. Also has bilateral eye implants for distance and close up vision.  . Abdominal hysterectomy  2013    at [place] History  Problem Relation Age of Onset  . Colon cancer Mother     Died from colon caner   .  Esophageal cancer Father   . Arthritis Father   . Heart disease Father   . Fibromyalgia Daughter   . Stroke Maternal Grandmother     No Known Allergies  Current Outpatient Prescriptions on File Prior to Visit  Medication Sig Dispense Refill  . aspirin 81 MG tablet Take 81 mg by mouth daily.      . Calcium-Magnesium-Vitamin D (CALCIUM MAGNESIUM PO) Take 1 tablet by mouth daily.     . Coenzyme Q10 (COQ10 PO) Take 2 tablets by mouth daily.    . ergocalciferol (VITAMIN D2) 50000 UNITS capsule Take 1 capsule (50,000 Units total) by mouth once a week. 4 capsule 3  . fluticasone (FLONASE) 50 MCG/ACT nasal spray Place 2 sprays into the nose daily. 16 g 3  . latanoprost (XALATAN) 0.005 % ophthalmic solution Place 1 drop into the right eye daily. 2.5 mL 2  . naproxen sodium (ANAPROX) 220 MG tablet Take 220 mg by mouth daily.    . Omega-3 Fatty Acids (FISH OIL)  1000 MG CAPS Take 1 capsule by mouth daily.     Marland Kitchen zolpidem (AMBIEN CR) 6.25 MG CR tablet Take 1 tablet (6.25 mg total) by mouth at bedtime as needed. 30 tablet 0   No current facility-administered medications on file prior to visit.    BP 136/70 mmHg  Pulse 83  Temp(Src) 97.6 F (36.4 C) (Oral)  Resp 16  Ht 5\' 2"  (1.575 m)  Wt 124 lb (56.246 kg)  BMI 22.67 kg/m2  SpO2 100%       Objective:   Physical Exam  Constitutional: She is oriented to person, place, and time. She appears well-developed and well-nourished. No distress.  HENT:  Head: Normocephalic and atraumatic.  Right Ear: Tympanic membrane and ear canal normal.  Left Ear: Tympanic membrane and ear canal normal.  Cardiovascular: Normal rate and regular rhythm.   No murmur heard. Pulmonary/Chest: Effort normal and breath sounds normal. No respiratory distress. She has no rales. She exhibits no tenderness.  Soft right sided expiratory wheeze which cleared with deep breathing  Musculoskeletal: She exhibits no edema.  Neurological: She is alert and oriented to person,  place, and time.  Psychiatric: She has a normal mood and affect. Her behavior is normal. Judgment and thought content normal.          Assessment & Plan:

## 2014-08-05 NOTE — Assessment & Plan Note (Signed)
Given duration and worsening cough, will rx with ceftin and prn tessalon.  Advised pt to call if symptoms worsen or if symptoms are not improved in 3 days.

## 2014-08-05 NOTE — Patient Instructions (Signed)
Stop weekly vitamin D supplement. Start once daily vit D over the counter 3000 units once daily. Start ceftin (antibiotic) for bronchitis. Start tessalon as needed for cough. Call if cough/congestion symptoms worsen or if not improved in 3 days.

## 2014-08-05 NOTE — Progress Notes (Signed)
Pre visit review using our clinic review tool, if applicable. No additional management support is needed unless otherwise documented below in the visit note. 

## 2014-08-05 NOTE — Assessment & Plan Note (Signed)
Bronchitis

## 2014-11-14 ENCOUNTER — Ambulatory Visit (INDEPENDENT_AMBULATORY_CARE_PROVIDER_SITE_OTHER): Payer: Medicare Other | Admitting: Family

## 2014-11-14 ENCOUNTER — Encounter: Payer: Self-pay | Admitting: Family

## 2014-11-14 VITALS — BP 110/60 | HR 83 | Temp 97.6°F | Resp 16 | Ht 62.0 in | Wt 121.6 lb

## 2014-11-14 DIAGNOSIS — E559 Vitamin D deficiency, unspecified: Secondary | ICD-10-CM

## 2014-11-14 DIAGNOSIS — R32 Unspecified urinary incontinence: Secondary | ICD-10-CM | POA: Diagnosis not present

## 2014-11-14 DIAGNOSIS — R350 Frequency of micturition: Secondary | ICD-10-CM | POA: Diagnosis not present

## 2014-11-14 DIAGNOSIS — G4700 Insomnia, unspecified: Secondary | ICD-10-CM

## 2014-11-14 LAB — URINALYSIS, ROUTINE W REFLEX MICROSCOPIC
Bilirubin Urine: NEGATIVE
Ketones, ur: NEGATIVE
Leukocytes, UA: NEGATIVE
NITRITE: NEGATIVE
PH: 6.5 (ref 5.0–8.0)
Specific Gravity, Urine: 1.02 (ref 1.000–1.030)
TOTAL PROTEIN, URINE-UPE24: NEGATIVE
Urine Glucose: NEGATIVE
Urobilinogen, UA: 0.2 (ref 0.0–1.0)

## 2014-11-14 MED ORDER — ZOLPIDEM TARTRATE ER 6.25 MG PO TBCR: 6.2500 mg | EXTENDED_RELEASE_TABLET | Freq: Every evening | ORAL | Status: DC | PRN

## 2014-11-14 NOTE — Assessment & Plan Note (Signed)
Not taking supplement. Will check vit d level to assess ongoing needs.

## 2014-11-14 NOTE — Progress Notes (Signed)
Subjective:    Patient ID: Alison Hammond, female    DOB: Dec 18, 1924, 81 y.o.   MRN: [id number]  HPI  Patient here to discuss urinary frequency. Reports that frequency has been occuring for several months.  She has been wearing depends to protect against leakage. Denies dysuria, or fever.    Vit D deficiency- reports that she is not currently taking any vit D supplement.    Past Medical History  Diagnosis Date  . Osteoporosis   . Chronic low back pain   . DDD (degenerative disc disease), lumbar   . Chronic lymphocytic leukemia   . Hx of adenomatous colonic polyps     no polyps in 1/08  . Hyperlipidemia   . Glaucoma   . Heart murmur   . History of blood transfusion     due to fracture of femur  . Insomnia 06/27/2014     Review of Systems See HPI  Past Medical History  Diagnosis Date  . Osteoporosis   . Chronic low back pain   . DDD (degenerative disc disease), lumbar   . Chronic lymphocytic leukemia   . Hx of adenomatous colonic polyps     no polyps in 1/08  . Hyperlipidemia   . Glaucoma   . Heart murmur   . History of blood transfusion     due to fracture of femur  . Insomnia 06/27/2014    History   Social History  . Marital Status: Single    Spouse Name: N/A  . Number of Children: 1  . Years of Education: N/A   Occupational History  .     Social History Main Topics  . Smoking status: Never Smoker   . Smokeless tobacco: Never Used  . Alcohol Use: 4.2 oz/week    7 Glasses of wine per week     Comment: 1 glass wine daily  . Drug Use: No  . Sexual Activity: Not on file   Other Topics Concern  . 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 Breast enhancement surgery  1978, 5956    with  silicone  . Knee arthroscopy  1996    right  . Repair of crushed femur  1997  . Cataract extraction      right eye. Also has bilateral eye implants for distance and close up vision.  . Abdominal hysterectomy  2013    at [facility] History  Problem Relation Age of Onset  . Colon cancer Mother     Died from colon caner   . Esophageal cancer Father   . Arthritis Father   . Heart disease Father   . Fibromyalgia Daughter   . Stroke Maternal Grandmother     No Known Allergies  Current Outpatient Prescriptions on File Prior to Visit  Medication Sig Dispense Refill  . aspirin 81 MG tablet Take 81 mg by mouth daily.      . Calcium-Magnesium-Vitamin D (CALCIUM MAGNESIUM PO) Take 1 tablet by mouth daily.     . Coenzyme Q10 (COQ10 PO) Take 2 tablets by mouth daily.    . fluticasone (FLONASE) 50 MCG/ACT nasal spray Place 2  sprays into the nose daily. 16 g 3  . latanoprost (XALATAN) 0.005 % ophthalmic solution Place 1 drop into the right eye daily. 2.5 mL 2  . naproxen sodium (ANAPROX) 220 MG tablet Take 220 mg by mouth daily.    . Omega-3 Fatty Acids (FISH OIL) 1000 MG CAPS Take 1 capsule by mouth daily.     Marland Kitchen zolpidem (AMBIEN CR) 6.25 MG CR tablet Take 1 tablet (6.25 mg total) by mouth at bedtime as needed. 30 tablet 0  . cholecalciferol (VITAMIN D) 1000 UNITS tablet Take 3 tablets (3,000 Units total) by mouth daily. (Patient not taking: Reported on 11/14/2014)    . ergocalciferol (VITAMIN D2) 50000 UNITS capsule Take 1 capsule (50,000 Units total) by mouth once a week. (Patient not taking: Reported on 11/14/2014) 4 capsule 3   No current facility-administered medications on file prior to visit.    BP 110/60 mmHg  Pulse 83  Temp(Src) 97.6 F (36.4 C) (Oral)  Resp 16  Ht 5\' 2"  (1.575 m)  Wt 121 lb 9.6 oz (55.157 kg)  BMI 22.24 kg/m2  SpO2 99%       Objective:    Physical Exam  Constitutional: She appears well-developed and well-nourished.  Cardiovascular: Normal  rate, regular rhythm and normal heart sounds.   No murmur heard. Pulmonary/Chest: Effort normal and breath sounds normal. No respiratory distress. She has no wheezes.  Musculoskeletal:  1+ bilateral LE edema  Psychiatric: She has a normal mood and affect. Her behavior is normal. Judgment and thought content normal.    BP 110/60 mmHg  Pulse 83  Temp(Src) 97.6 F (36.4 C) (Oral)  Resp 16  Ht 5\' 2"  (1.575 m)  Wt 121 lb 9.6 oz (55.157 kg)  BMI 22.24 kg/m2  SpO2 99% Wt Readings from Last 3 Encounters:  11/14/14 121 lb 9.6 oz (55.157 kg)  08/05/14 124 lb (56.246 kg)  06/27/14 1234 lb (559.739 kg)     Lab Results  Component Value Date   WBC 12.9* 06/27/2014   HGB 12.7 06/27/2014   HCT 39.7 06/27/2014   PLT 237 06/27/2014   GLUCOSE 125 06/27/2014   ALT 14 06/27/2014   AST 18 06/27/2014   NA 140 06/27/2014   K 4.4 06/27/2014   CL 101 11/06/2012   CREATININE 1.0 06/27/2014   BUN 23.7 06/27/2014   CO2 27 06/27/2014   HGBA1C 5.5 12/24/2012    No results found.     Assessment & Plan:   Problem List Items Addressed This Visit    Insomnia - Primary       Nance Pear., NP

## 2014-11-14 NOTE — Patient Instructions (Signed)
Please complete lab work prior to leaving. Schedule medicare wellness visit at the front desk.

## 2014-11-14 NOTE — Assessment & Plan Note (Signed)
Discussed kegle exercises.  Obtain UA and culture today to exclude infection.  If Urine studies negative for infection we discussed trial of overactive bladder med. Pt is agreeable. We discussed common side effect of dry mouth.

## 2014-11-14 NOTE — Progress Notes (Signed)
Pre visit review using our clinic review tool, if applicable. No additional management support is needed unless otherwise documented below in the visit note. 

## 2014-11-15 ENCOUNTER — Ambulatory Visit: Payer: Medicare Other | Admitting: Family

## 2014-11-16 LAB — URINE CULTURE
Colony Count: NO GROWTH
Organism ID, Bacteria: NO GROWTH

## 2014-11-18 ENCOUNTER — Telehealth: Payer: Self-pay | Admitting: Family

## 2014-11-18 MED ORDER — TOLTERODINE TARTRATE 2 MG PO TABS: 2.0000 mg | ORAL_TABLET | Freq: Two times a day (BID) | ORAL | Status: DC

## 2014-11-18 NOTE — Telephone Encounter (Signed)
Urine culture negative for bacteria.  There was trace blood.  Repeat UA in 2 weeks dx is hematuria.  Also, I will send rx for overactive bladder med for her to try.

## 2014-11-21 NOTE — Telephone Encounter (Signed)
Left message for pt to return my call.

## 2014-11-22 NOTE — Telephone Encounter (Signed)
Pt returning your call back please call after 3pm.

## 2014-11-23 NOTE — Telephone Encounter (Signed)
Left detailed message on voicemail with below recommendations and that we can repeat urine at her f/u on 12/05/14 with Melissa and to call if any questions.

## 2014-11-23 NOTE — Telephone Encounter (Signed)
Patient calling back again. Best [phone]

## 2014-12-05 ENCOUNTER — Encounter: Payer: Medicare Other | Admitting: Family

## 2014-12-06 ENCOUNTER — Ambulatory Visit (INDEPENDENT_AMBULATORY_CARE_PROVIDER_SITE_OTHER): Payer: Medicare Other | Admitting: Family

## 2014-12-06 ENCOUNTER — Encounter: Payer: Self-pay | Admitting: Family

## 2014-12-06 VITALS — BP 118/80 | HR 75 | Temp 97.5°F | Resp 16 | Ht 62.0 in | Wt 123.0 lb

## 2014-12-06 DIAGNOSIS — Z0000 Encounter for general adult medical examination without abnormal findings: Secondary | ICD-10-CM

## 2014-12-06 DIAGNOSIS — R030 Elevated blood-pressure reading, without diagnosis of hypertension: Secondary | ICD-10-CM

## 2014-12-06 DIAGNOSIS — Z13820 Encounter for screening for osteoporosis: Secondary | ICD-10-CM

## 2014-12-06 DIAGNOSIS — R319 Hematuria, unspecified: Secondary | ICD-10-CM | POA: Diagnosis not present

## 2014-12-06 DIAGNOSIS — C9590 Leukemia, unspecified not having achieved remission: Secondary | ICD-10-CM

## 2014-12-06 DIAGNOSIS — Z23 Encounter for immunization: Secondary | ICD-10-CM | POA: Diagnosis not present

## 2014-12-06 DIAGNOSIS — E559 Vitamin D deficiency, unspecified: Secondary | ICD-10-CM

## 2014-12-06 DIAGNOSIS — IMO0001 Reserved for inherently not codable concepts without codable children: Secondary | ICD-10-CM

## 2014-12-06 DIAGNOSIS — R269 Unspecified abnormalities of gait and mobility: Secondary | ICD-10-CM

## 2014-12-06 DIAGNOSIS — M810 Age-related osteoporosis without current pathological fracture: Secondary | ICD-10-CM

## 2014-12-06 LAB — URINALYSIS, ROUTINE W REFLEX MICROSCOPIC
Bilirubin Urine: NEGATIVE
Ketones, ur: NEGATIVE
NITRITE: NEGATIVE
PH: 6 (ref 5.0–8.0)
SPECIFIC GRAVITY, URINE: 1.015 (ref 1.000–1.030)
TOTAL PROTEIN, URINE-UPE24: NEGATIVE
URINE GLUCOSE: NEGATIVE
Urobilinogen, UA: 0.2 (ref 0.0–1.0)

## 2014-12-06 LAB — CBC WITH DIFFERENTIAL/PLATELET
ATYP LYMP MAN: REACTIVE — AB
BASOS ABS: 0.1 10*3/uL (ref 0.0–0.1)
Basophils Relative: 0.5 % (ref 0.0–3.0)
Eosinophils Absolute: 0.2 10*3/uL (ref 0.0–0.7)
Eosinophils Relative: 2.1 % (ref 0.0–5.0)
HEMATOCRIT: 40 % (ref 36.0–46.0)
Hemoglobin: 13.3 g/dL (ref 12.0–15.0)
Lymphs Abs: 6.7 10*3/uL — ABNORMAL HIGH (ref 0.7–4.0)
MCHC: 33.1 g/dL (ref 30.0–36.0)
MCV: 88 fl (ref 78.0–100.0)
MONOS PCT: 5.3 % (ref 3.0–12.0)
Monocytes Absolute: 0.6 10*3/uL (ref 0.1–1.0)
Neutro Abs: 4.2 10*3/uL (ref 1.4–7.7)
Neutrophils Relative %: 35.7 % — ABNORMAL LOW (ref 43.0–77.0)
PLATELETS: 287 10*3/uL (ref 150.0–400.0)
RBC: 4.55 Mil/uL (ref 3.87–5.11)
RDW: 14.7 % (ref 11.5–15.5)
WBC: 11.8 10*3/uL — ABNORMAL HIGH (ref 4.0–10.5)

## 2014-12-06 LAB — BASIC METABOLIC PANEL
BUN: 20 mg/dL (ref 6–23)
CALCIUM: 9.2 mg/dL (ref 8.4–10.5)
CO2: 27 mEq/L (ref 19–32)
Chloride: 102 mEq/L (ref 96–112)
Creatinine, Ser: 0.8 mg/dL (ref 0.40–1.20)
GFR: 71.97 mL/min (ref >60.00)
GLUCOSE: 102 mg/dL — AB (ref 70–99)
Potassium: 4.4 mEq/L (ref 3.5–5.1)
SODIUM: 137 meq/L (ref 135–145)

## 2014-12-06 LAB — VITAMIN D 25 HYDROXY (VIT D DEFICIENCY, FRACTURES): VITD: 24.01 ng/mL — ABNORMAL LOW (ref 30.00–100.00)

## 2014-12-06 NOTE — Progress Notes (Signed)
Pre visit review using our clinic review tool, if applicable. No additional management support is needed unless otherwise documented below in the visit note. 

## 2014-12-06 NOTE — Patient Instructions (Signed)
Please complete lab work prior to leaving.  Follow up in 6 months.  

## 2014-12-06 NOTE — Progress Notes (Addendum)
Subjective:    Alison Hammond is a 81 y.o. female who presents for Medicare Annual/Subsequent preventive examination.  Preventive Screening-Counseling & Management  Tobacco History  Smoking status  . Never Smoker   Smokeless tobacco  . Never Used     Problems Prior to Visit 1. Urinary Incontinence- on Detrol  2. Insomnia- on ambien  3.  Vit D deficiency-  On caltrate + D  4.  Glaucoma- followed by opthalmology on xalatan.  Current Problems (verified) Patient Active Problem List   Diagnosis Date Noted  . Urinary incontinence 11/14/2014  . Insomnia 06/27/2014  . Dandruff 04/21/2013  . Allergic rhinitis 12/27/2012  . Vitamin D deficiency 02/27/2009  . GLAUCOMA 02/27/2009  . SCOLIOSIS 02/27/2009  . Chronic lymphocytic leukemia 02/09/2009  . DEGENERATIVE DISC DISEASE, LUMBAR SPINE 02/09/2009  . SENILE OSTEOPOROSIS 02/09/2009  . ELEVATED BLOOD PRESSURE WITHOUT DIAGNOSIS OF HYPERTENSION 02/09/2009    Medications Prior to Visit Current Outpatient Prescriptions on File Prior to Visit  Medication Sig Dispense Refill  . aspirin 81 MG tablet Take 81 mg by mouth daily.      . Calcium-Magnesium-Vitamin D (CALCIUM MAGNESIUM PO) Take 1 tablet by mouth daily.     . Coenzyme Q10 (COQ10 PO) Take 2 tablets by mouth daily.    . fluticasone (FLONASE) 50 MCG/ACT nasal spray Place 2 sprays into the nose daily. 16 g 3  . latanoprost (XALATAN) 0.005 % ophthalmic solution Place 1 drop into the right eye daily. 2.5 mL 2  . loratadine (CLARITIN) 10 MG tablet Take 10 mg by mouth daily as needed for allergies.    . naproxen sodium (ANAPROX) 220 MG tablet Take 220 mg by mouth daily.    . Omega-3 Fatty Acids (FISH OIL) 1000 MG CAPS Take 1 capsule by mouth daily.     Marland Kitchen tolterodine (DETROL) 2 MG tablet Take 1 tablet (2 mg total) by mouth 2 (two) times daily. 60 tablet 2  . zolpidem (AMBIEN CR) 6.25 MG CR tablet Take 1 tablet (6.25 mg total) by mouth at bedtime as needed. 30 tablet 0   No current  facility-administered medications on file prior to visit.    Current Medications (verified) Current Outpatient Prescriptions  Medication Sig Dispense Refill  . aspirin 81 MG tablet Take 81 mg by mouth daily.      . Calcium-Magnesium-Vitamin D (CALCIUM MAGNESIUM PO) Take 1 tablet by mouth daily.     . Coenzyme Q10 (COQ10 PO) Take 2 tablets by mouth daily.    . fluticasone (FLONASE) 50 MCG/ACT nasal spray Place 2 sprays into the nose daily. 16 g 3  . latanoprost (XALATAN) 0.005 % ophthalmic solution Place 1 drop into the right eye daily. 2.5 mL 2  . loratadine (CLARITIN) 10 MG tablet Take 10 mg by mouth daily as needed for allergies.    . naproxen sodium (ANAPROX) 220 MG tablet Take 220 mg by mouth daily.    . Omega-3 Fatty Acids (FISH OIL) 1000 MG CAPS Take 1 capsule by mouth daily.     Marland Kitchen tolterodine (DETROL) 2 MG tablet Take 1 tablet (2 mg total) by mouth 2 (two) times daily. 60 tablet 2  . zolpidem (AMBIEN CR) 6.25 MG CR tablet Take 1 tablet (6.25 mg total) by mouth at bedtime as needed. 30 tablet 0   No current facility-administered medications for this visit.     Allergies (verified) Review of patient's allergies indicates no known allergies.   PAST HISTORY  Family History Family History  Problem Relation  Age of Onset  . Colon cancer Mother     Died from colon caner   . Esophageal cancer Father   . Arthritis Father   . Heart disease Father   . Fibromyalgia Daughter   . Stroke Maternal Grandmother     Social History History  Substance Use Topics  . Smoking status: Never Smoker   . Smokeless tobacco: Never Used  . Alcohol Use: 4.2 oz/week    7 Glasses of wine per week     Comment: 1 glass wine daily     Are there smokers in your home (other than you)? No  Risk Factors Current exercise habits: Home exercise routine includes yoga and swimming, stretching, balance.  Dietary issues discussed: healthy diet   Cardiac risk factors: advanced age (older than 49 for men,  58 for women).  Depression Screen (Note: if answer to either of the following is "Yes", a more complete depression screening is indicated)   Over the past two weeks, have you felt down, depressed or hopeless? No  Over the past two weeks, have you felt little interest or pleasure in doing things? No  Have you lost interest or pleasure in daily life? No  Do you often feel hopeless? No  Do you cry easily over simple problems? No  Activities of Daily Living In your present state of health, do you have any difficulty performing the following activities?:  Driving? No Managing money?  No Feeding yourself? No Getting from bed to chair? No Climbing a flight of stairs? No Preparing food and eating?: No Bathing or showering? No Getting dressed: No Getting to the toilet? No Using the toilet:No Moving around from place to place: No In the past year have you fallen or had a near fall?:Yes- has had some tripping/falls, none recently.     Are you sexually active?  No  Do you have more than one partner?  No  Hearing Difficulties: No Do you often ask people to speak up or repeat themselves? No Do you experience ringing or noises in your ears? No Do you have difficulty understanding soft or whispered voices? No   Do you feel that you have a problem with memory? No  Do you often misplace items? No  Do you feel safe at home?  Yes  Cognitive Testing  Alert? Yes  Normal Appearance?Yes  Oriented to person? Yes  Place? Yes   Time? Yes  Recall of three objects?  Yes  Can perform simple calculations? Yes  Displays appropriate judgment?Yes  Can read the correct time from a watch face?Yes   Advanced Directives have been discussed with the patient? Yes  List the Names of Other Physician/Practitioners you currently use: 1.  See care team  Indicate any recent Medical Services you may have received from other than [facility] providers in the past year (date may be approximate).  Immunization History   Administered Date(s) Administered  . Hepatitis A 09/16/1994, 09/17/1995  . Influenza,inj,Quad PF,36+ Mos 06/23/2013, 05/16/2014  . Influenza-Unspecified 06/16/2012  . Pneumococcal Conjugate-13 09/16/1998, 09/16/2008  . Td 09/16/2008  . Zoster 09/17/2011    Screening Tests Health Maintenance  Topic Date Due  . COLONOSCOPY  01/22/1977  . PNA vac Low Risk Adult (2 of 2 - PPSV23) 09/16/2009  . MAMMOGRAM  02/25/2015  . INFLUENZA VACCINE  04/17/2015  . TETANUS/TDAP  09/16/2018  . DEXA SCAN  Completed  . ZOSTAVAX  Completed    All answers were reviewed with the patient and necessary referrals were  made:  O'SULLIVAN,Anniece Bleiler S., NP   12/06/2014   History reviewed: allergies, current medications, past family history, past medical history, past social history, past surgical history and problem list  Review of Systems A comprehensive review of systems was negative except for: Ears, nose, mouth, throat, and face: positive for seasonal allergic rhinitis Hearing loss- wears hearing aids  GI- + constipation- relieved by prune juice GU- + urinary frequency- chronic MS- + OA joint pain  Objective:     Vision by Snellen chart: right eye:see nursing, left eye:see nursing   Physical Exam  Constitutional: She is oriented to person, place, and time. She appears well-developed and well-nourished. No distress.  HENT:  Head: Normocephalic and atraumatic.  Right Ear: Tympanic membrane and ear canal normal.  Left Ear: Tympanic membrane and ear canal normal.  Mouth/Throat: Oropharynx is clear and moist.  Eyes: Pupils are equal, round, and reactive to light. No scleral icterus.  Neck: Normal range of motion. No thyromegaly present.  Cardiovascular: Normal rate and regular rhythm.   No murmur heard. Pulmonary/Chest: Effort normal and breath sounds normal. No respiratory distress. He has no wheezes. She has no rales. She exhibits no tenderness.  Abdominal: Soft. Bowel sounds are normal. He  exhibits no distension and no mass. There is no tenderness. There is no rebound and no guarding.  Musculoskeletal: She exhibits no edema.  Lymphadenopathy:    She has no cervical adenopathy.  Neurological: She is alert and oriented to person, place, and time. She has normal patellar reflexes. She exhibits normal muscle tone. Coordination normal.  Skin: Skin is warm and dry.  Psychiatric: She has a normal mood and affect. Her behavior is normal. Judgment and thought content normal.  Breasts: Examined lying Right: Calcified breast implants- limited exam. No discharge or axillary adenopathy.  Left: calcified breast implants- limited exam no discharge or axillary adenopathy.  Inguinal/mons: Normal without inguinal adenopathy         Assessment & Plan:         Assessment:          Plan:     During the course of the visit the patient was educated and counseled about appropriate screening and preventive services including:    Pneumococcal vaccine   Bone densitometry screening  referral for PT to help with balance/strength.   Diet review for nutrition referral? Yes ____  Not Indicated _x___   Patient Instructions (the written plan) was given to the patient.  Medicare Attestation I have personally reviewed: The patient's medical and social history Their use of alcohol, tobacco or illicit drugs Their current medications and supplements The patient's functional ability including ADLs,fall risks, home safety risks, cognitive, and hearing and visual impairment Diet and physical activities Evidence for depression or mood disorders  The patient's weight, height, BMI, and visual acuity have been recorded in the chart.  I have made referrals, counseling, and provided education to the patient based on review of the above and I have provided the patient with a written personalized care plan for preventive services.     O'SULLIVAN,Mehtaab Mayeda S., NP   12/06/2014

## 2014-12-09 ENCOUNTER — Telehealth: Payer: Self-pay | Admitting: Family

## 2014-12-09 DIAGNOSIS — E559 Vitamin D deficiency, unspecified: Secondary | ICD-10-CM

## 2014-12-09 MED ORDER — VITAMIN D (ERGOCALCIFEROL) 1.25 MG (50000 UNIT) PO CAPS: 50000.0000 [IU] | ORAL_CAPSULE | ORAL | Status: DC

## 2014-12-09 NOTE — Telephone Encounter (Signed)
Vit D is low. Start Vit D 50000 units once weekly, repeat vit D in 12 weeks. Blood count is stable. Sugar is mildly elevated but not in the diabetes range- we will keep an eye on this.

## 2014-12-13 ENCOUNTER — Ambulatory Visit: Payer: Medicare Other | Attending: Family | Admitting: Physical Therapy

## 2014-12-13 ENCOUNTER — Encounter: Payer: Self-pay | Admitting: Physical Therapy

## 2014-12-13 DIAGNOSIS — R262 Difficulty in walking, not elsewhere classified: Secondary | ICD-10-CM | POA: Insufficient documentation

## 2014-12-13 DIAGNOSIS — R269 Unspecified abnormalities of gait and mobility: Secondary | ICD-10-CM | POA: Diagnosis not present

## 2014-12-13 NOTE — Telephone Encounter (Signed)
Handled.  Order placed.

## 2014-12-13 NOTE — Telephone Encounter (Signed)
Called the patient informed of results and instructions.  The patient did verbalize an understanding of results/instructions.  Attempted to Put order in for repeat Vit. D check the end on June 2016, but system would not allow, will forward note back to provider to do.  Will call the patient back after lunch today to schedule lab appt.
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
## 2014-12-13 NOTE — Telephone Encounter (Signed)
Patient has been scheduled to repeat Vitamin D level on March 16, 2015 at 10 AM in the lab.  Will forward note back to PCP as was unable to order Vitamin D system would not allow.

## 2014-12-16 ENCOUNTER — Ambulatory Visit: Payer: Medicare Other | Attending: Family | Admitting: Physical Therapy

## 2014-12-16 DIAGNOSIS — R29898 Other symptoms and signs involving the musculoskeletal system: Secondary | ICD-10-CM

## 2014-12-16 DIAGNOSIS — R269 Unspecified abnormalities of gait and mobility: Secondary | ICD-10-CM | POA: Diagnosis not present

## 2014-12-16 DIAGNOSIS — R262 Difficulty in walking, not elsewhere classified: Secondary | ICD-10-CM | POA: Diagnosis not present

## 2014-12-16 NOTE — Therapy (Signed)
[facility] [address]  [place], [place], [address] Phone: [phone]   Fax:  [phone]  Physical Therapy Treatment  Patient Details  Name: Alison Hammond MRN: [id number] Date of Birth: 1924-12-07 Referring Provider:  Debbrah Alar, NP  Encounter Date: 12/16/2014      PT End of Session - 12/16/14 1027    Visit Number 2   Number of Visits 12   Date for PT Re-Evaluation 01/24/15   PT Start Time 1020   PT Stop Time 1053   PT Time Calculation (min) 33 min      Past Medical History  Diagnosis Date  . Osteoporosis   . Chronic low back pain   . DDD (degenerative disc disease), lumbar   . Chronic lymphocytic leukemia   . Hx of adenomatous colonic polyps     no polyps in 1/08  . Hyperlipidemia   . Glaucoma   . Heart murmur   . History of blood transfusion     due to fracture of femur  . Insomnia 06/27/2014    Past Surgical History  Procedure Laterality Date  . Breast enhancement surgery  1978, 8676    with silicone  . Knee arthroscopy  1996    right  . Repair of crushed femur  1997  . Cataract extraction      right eye. Also has bilateral eye implants for distance and close up vision.  . Abdominal hysterectomy  2013    at [facility]    There were no vitals filed for this visit.  Visit Diagnosis:  Abnormality of gait  Difficulty walking  Weakness of both lower extremities       TODAY'S TREATMENT: Seated Hip ABD Blue TB 12x, Black TB 15x Seated Hip ADD Blue TB 12x Standing Hip ABD Red TB at ankles 10x each at back of chair Standing Hip Ext Red TB 10x each at back of chair B Knee Flexion Machine 15# 12x Standing B Heel Raise 15x at back of chair 4" Step-up 5x each with SPC and back of chair assist Seated Low Row while maintaining upright posture Blue TB 12x                        PT Short Term Goals - 12/16/14 1055    PT SHORT TERM GOAL #1   Title pt  independent with initial HEP by 12/26/14   Status On-going           PT Long Term Goals - 12/16/14 1055    PT LONG TERM GOAL #1   Status On-going   PT LONG TERM GOAL #2   Title pt reports not limiting activity due to fear of falling by 01/23/15   Status On-going   PT LONG TERM GOAL #3   Title pt able to ambulate with SPC while displaying appropriate foot clearance, stride length, along with more upright posture by 01/23/15   Status On-going   PT LONG TERM GOAL #4   Title pt improves TUG to 16" or less by 01/23/15   Status On-going               Plan - 12/16/14 1055    Clinical Impression Statement tolerated today's initial session well.  Very weak with knee flexion machine (unable to perform with 20# and 15# had limited ROM last couple reps).   PT Next Visit Plan hip stability exercises, balance training as able  Problem List Patient Active Problem List   Diagnosis Date Noted  . Urinary incontinence 11/14/2014  . Insomnia 06/27/2014  . Dandruff 04/21/2013  . Allergic rhinitis 12/27/2012  . Vitamin D deficiency 02/27/2009  . GLAUCOMA 02/27/2009  . SCOLIOSIS 02/27/2009  . Chronic lymphocytic leukemia 02/09/2009  . DEGENERATIVE DISC DISEASE, LUMBAR SPINE 02/09/2009  . SENILE OSTEOPOROSIS 02/09/2009  . ELEVATED BLOOD PRESSURE WITHOUT DIAGNOSIS OF HYPERTENSION 02/09/2009    Rontavious Albright PT, OCS 12/16/2014, 10:56 AM  [facility] [place], [place], [address] Phone: [phone]   Fax:  [phone]

## 2014-12-19 ENCOUNTER — Ambulatory Visit: Payer: Medicare Other | Admitting: Physical Therapy

## 2014-12-19 DIAGNOSIS — R262 Difficulty in walking, not elsewhere classified: Secondary | ICD-10-CM | POA: Diagnosis not present

## 2014-12-19 DIAGNOSIS — R269 Unspecified abnormalities of gait and mobility: Secondary | ICD-10-CM | POA: Diagnosis not present

## 2014-12-19 DIAGNOSIS — R29898 Other symptoms and signs involving the musculoskeletal system: Secondary | ICD-10-CM
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
## 2014-12-22 ENCOUNTER — Ambulatory Visit: Payer: Medicare Other | Admitting: Physical Therapy

## 2014-12-27 ENCOUNTER — Ambulatory Visit: Payer: Medicare Other | Admitting: Physical Therapy

## 2014-12-27 DIAGNOSIS — R29898 Other symptoms and signs involving the musculoskeletal system: Secondary | ICD-10-CM

## 2014-12-27 DIAGNOSIS — R269 Unspecified abnormalities of gait and mobility: Secondary | ICD-10-CM | POA: Diagnosis not present

## 2014-12-27 DIAGNOSIS — R262 Difficulty in walking, not elsewhere classified: Secondary | ICD-10-CM | POA: Diagnosis not present

## 2014-12-27 NOTE — Therapy (Signed)
[facility] [address]  [place], [place], [address] Phone: [phone]   Fax:  [phone]  Physical Therapy Treatment  Patient Details  Name: Alison Hammond MRN: [id number] Date of Birth: 02-22-1925 Referring Provider:  Debbrah Alar, NP  Encounter Date: 12/27/2014      PT End of Session - 12/27/14 1413    Visit Number 4   Number of Visits 12   Date for PT Re-Evaluation 01/24/15   PT Start Time 7616   PT Stop Time 0737   PT Time Calculation (min) 39 min      Past Medical History  Diagnosis Date  . Osteoporosis   . Chronic low back pain   . DDD (degenerative disc disease), lumbar   . Chronic lymphocytic leukemia   . Hx of adenomatous colonic polyps     no polyps in 1/08  . Hyperlipidemia   . Glaucoma   . Heart murmur   . History of blood transfusion     due to fracture of femur  . Insomnia 06/27/2014    Past Surgical History  Procedure Laterality Date  . Breast enhancement surgery  1978, 1062    with silicone  . Knee arthroscopy  1996    right  . Repair of crushed femur  1997  . Cataract extraction      right eye. Also has bilateral eye implants for distance and close up vision.  . Abdominal hysterectomy  2013    at [facility]    There were no vitals filed for this visit.  Visit Diagnosis:  Weakness of both lower extremities  Abnormality of gait  Difficulty walking      Subjective Assessment - 12/27/14 1411    Subjective pt states she experiencing pain in L upper buttock today for unknown reason.  States pain began this AM.  States is less intense now because she rested before PT.   Currently in Pain? Yes   Pain Score 8    Pain Location Buttocks   Pain Orientation Left   Multiple Pain Sites No         TODAY'S TREATMENT: B Knee Ext Machine 10# 2x10 (1st set was 1st half of ROM and 2nd set top half of ROM) B Knee Flexion Machine 15# 2x15 Stretch B HS, Piri,  SKTC Hooklying Hip ABD Black TB 2x15 Hooklying Hip ADD Blue TB 2x15 Bridge 10x Pelvic Tilt 10x3" Side-Lying Clam 12x each Side-Lying Hip ABD 10x each (requires assistance with these due to weakness)                        PT Short Term Goals - 12/19/14 1547    PT SHORT TERM GOAL #1   Title pt independent with initial HEP by 12/26/14   Status On-going           PT Long Term Goals - 12/19/14 1548    PT LONG TERM GOAL #1   Title pt scores 49/56 or better on Berg by 01/23/15   Status On-going   PT LONG TERM GOAL #2   Title pt reports not limiting activity due to fear of falling by 01/23/15   Status On-going   PT LONG TERM GOAL #3   Title pt able to ambulate with SPC while displaying appropriate foot clearance, stride length, along with more upright posture by 01/23/15   Status On-going   PT LONG TERM GOAL #4   Title pt improves  TUG to 16" or less by 01/23/15   Status On-going               Plan - 12/27/14 1449    Clinical Impression Statement reduced standing activities and focused on hip strengthening and stretching today due to c/o L hip pain at start of treatment for unknown reason.  Rather profound weakness to B hip ABD noted today (3-/5 bilaterally).  She reports feeling better following today's treatment.   PT Next Visit Plan hip stability exercises, balance training as able        Problem List Patient Active Problem List   Diagnosis Date Noted  . Urinary incontinence 11/14/2014  . Insomnia 06/27/2014  . Dandruff 04/21/2013  . Allergic rhinitis 12/27/2012  . Vitamin D deficiency 02/27/2009  . GLAUCOMA 02/27/2009  . SCOLIOSIS 02/27/2009  . Chronic lymphocytic leukemia 02/09/2009  . DEGENERATIVE DISC DISEASE, LUMBAR SPINE 02/09/2009  . SENILE OSTEOPOROSIS 02/09/2009  . ELEVATED BLOOD PRESSURE WITHOUT DIAGNOSIS OF HYPERTENSION 02/09/2009    Christine Morton PT, OCS 12/27/2014, 2:51 PM  [facility] [address]  [place], [place], [address] Phone: [phone]   Fax:  [phone]

## 2014-12-29 ENCOUNTER — Ambulatory Visit: Payer: Medicare Other | Admitting: Physical Therapy

## 2014-12-29 DIAGNOSIS — R269 Unspecified abnormalities of gait and mobility: Secondary | ICD-10-CM

## 2014-12-29 DIAGNOSIS — R262 Difficulty in walking, not elsewhere classified: Secondary | ICD-10-CM | POA: Diagnosis not present

## 2014-12-29 DIAGNOSIS — R29898 Other symptoms and signs involving the musculoskeletal system: Secondary | ICD-10-CM

## 2014-12-29 NOTE — Therapy (Signed)
[facility] [address]  [place], [place], [address] Phone: [phone]   Fax:  [phone]  Physical Therapy Treatment  Patient Details  Name: Alison Hammond MRN: [id number] Date of Birth: [birth year] Referring Provider:  Debbrah Alar, NP  Encounter Date: 12/29/2014      PT End of Session - 12/29/14 1459    Visit Number 5   Number of Visits 12   Date for PT Re-Evaluation 01/24/15   PT Start Time 9326   PT Stop Time 1531   PT Time Calculation (min) 34 min      Past Medical History  Diagnosis Date  . Osteoporosis   . Chronic low back pain   . DDD (degenerative disc disease), lumbar   . Chronic lymphocytic leukemia   . Hx of adenomatous colonic polyps     no polyps in 1/08  . Hyperlipidemia   . Glaucoma   . Heart murmur   . History of blood transfusion     due to fracture of femur  . Insomnia 06/27/2014    Past Surgical History  Procedure Laterality Date  . Breast enhancement surgery  1978, 7124    with silicone  . Knee arthroscopy  1996    right  . Repair of crushed femur  1997  . Cataract extraction      right eye. Also has bilateral eye implants for distance and close up vision.  . Abdominal hysterectomy  2013    at [facility]    There were no vitals filed for this visit.  Visit Diagnosis:  Abnormality of gait  Weakness of both lower extremities  Difficulty walking      Subjective Assessment - 12/29/14 1459    Subjective no pain today, states has been feeling "real good"   Currently in Pain? No/denies           TODAY'S TREATMENT TherEx - Seated Hip ABD Black TB 15x, Add black TB 15x LAQ 3# 15x, Seated Knee Flexion Black TB 15x Seated one-hand low row Red TB 10x each B Heel Raise at UBE 15x Toe Tapping to 9" step with 2# cuffs at ankles 6x with SPC A then 6x without SPC A (note mild [place] when not using cane, denies pain) Standing Hip ABD Red TB 2x12  each                     PT Education - 12/29/14 1534    Education Details side stepping along hallway with railing at [employer]) Educated Patient   Methods Explanation;Demonstration   Comprehension Verbalized understanding          PT Short Term Goals - 12/29/14 1533    PT SHORT TERM GOAL #1   Title pt independent with initial HEP by 12/26/14   Status Achieved           PT Long Term Goals - 12/29/14 1533    PT LONG TERM GOAL #1   Title pt scores 49/56 or better on Berg by 01/23/15   Status On-going   PT LONG TERM GOAL #2   Title pt reports not limiting activity due to fear of falling by 01/23/15   Status On-going   PT LONG TERM GOAL #3   Title pt able to ambulate with SPC while displaying appropriate foot clearance, stride length, along with more upright posture by 01/23/15   Status On-going   PT LONG TERM GOAL #4  Title pt improves TUG to 16" or less by 01/23/15   Status On-going               Plan - 12/29/14 1532    Clinical Impression Statement progressing slowly with strength but good motivation and works hard in clinic, added hip ABD to HEP.   PT Next Visit Plan hip stability exercises, balance training as able   Consulted and Agree with Plan of Care Patient        Problem List Patient Active Problem List   Diagnosis Date Noted  . Urinary incontinence 11/14/2014  . Insomnia 06/27/2014  . Dandruff 04/21/2013  . Allergic rhinitis 12/27/2012  . Vitamin D deficiency 02/27/2009  . GLAUCOMA 02/27/2009  . SCOLIOSIS 02/27/2009  . Chronic lymphocytic leukemia 02/09/2009  . DEGENERATIVE DISC DISEASE, LUMBAR SPINE 02/09/2009  . SENILE OSTEOPOROSIS 02/09/2009  . ELEVATED BLOOD PRESSURE WITHOUT DIAGNOSIS OF HYPERTENSION 02/09/2009    Torrell Krutz PT, OCS 12/29/2014, 3:34 PM  [facility] [place], [place], [address] Phone: [phone]   Fax:   [phone]

## 2015-01-02 ENCOUNTER — Ambulatory Visit: Payer: Medicare Other | Admitting: Physical Therapy

## 2015-01-02 DIAGNOSIS — R269 Unspecified abnormalities of gait and mobility: Secondary | ICD-10-CM | POA: Diagnosis not present

## 2015-01-02 DIAGNOSIS — R29898 Other symptoms and signs involving the musculoskeletal system: Secondary | ICD-10-CM

## 2015-01-02 DIAGNOSIS — R262 Difficulty in walking, not elsewhere classified: Secondary | ICD-10-CM | POA: Diagnosis not present

## 2015-01-02 NOTE — Therapy (Signed)
[facility] [address]  [place], [place], [address] Phone: [phone]   Fax:  [phone]  Physical Therapy Treatment  Patient Details  Name: Alison Hammond MRN: [id number] Date of Birth: 06-24-1925 Referring Provider:  Debbrah Alar, NP  Encounter Date: 01/02/2015      PT End of Session - 01/02/15 1454    Visit Number 6   Number of Visits 12   Date for PT Re-Evaluation 01/24/15   PT Start Time 3500   PT Stop Time 1452   PT Time Calculation (min) 47 min      Past Medical History  Diagnosis Date  . Osteoporosis   . Chronic low back pain   . DDD (degenerative disc disease), lumbar   . Chronic lymphocytic leukemia   . Hx of adenomatous colonic polyps     no polyps in 1/08  . Hyperlipidemia   . Glaucoma   . Heart murmur   . History of blood transfusion     due to fracture of femur  . Insomnia 06/27/2014    Past Surgical History  Procedure Laterality Date  . Breast enhancement surgery  1978, 9381    with silicone  . Knee arthroscopy  1996    right  . Repair of crushed femur  1997  . Cataract extraction      right eye. Also has bilateral eye implants for distance and close up vision.  . Abdominal hysterectomy  2013    at [facility]    There were no vitals filed for this visit.  Visit Diagnosis:  Abnormality of gait  Weakness of both lower extremities  Difficulty walking      Subjective Assessment - 01/02/15 1407    Subjective States has been performing HEP update (side-stepping with railing in halls) noting appropriate burn in hips.   Currently in Pain? No/denies          TODAY'S TREATMENT: TherEx -  Seated Hip ABD Black TB 20x 8" Step Toe tapping 10x each Standing on Foam Pad 8"  4" diagonal toe tapping to R with Red TB at ankles 8x then 8x to L without TB due to unable to perform with TB (both with single pole A) 4" ALT step-up with B pole A 4x (difficult and  fearful) Seated One-Arm low row with diagonal Red TB 15x Bridge 5x, Bridge with March 3x2 each leg, Bridge with Alt Knee Extension 5x Gait Training - [facility] training with cane from our clinic.  Pt's SPC is too tall for her despite being in its lowest setting.  Gait training focused on equal strides with upright posture.                       PT Education - 01/02/15 1457    Education provided Yes   Education Details gait training and advised of proper SPC height   Person(s) Educated Patient;Child(ren)   Methods Explanation;Demonstration   Comprehension Verbalized understanding;Returned demonstration          PT Short Term Goals - 12/29/14 1533    PT SHORT TERM GOAL #1   Title pt independent with initial HEP by 12/26/14   Status Achieved           PT Long Term Goals - 12/29/14 1533    PT LONG TERM GOAL #1   Title pt scores 49/56 or better on Berg by 01/23/15   Status On-going   PT LONG TERM GOAL #  2   Title pt reports not limiting activity due to fear of falling by 01/23/15   Status On-going   PT LONG TERM GOAL #3   Title pt able to ambulate with SPC while displaying appropriate foot clearance, stride length, along with more upright posture by 01/23/15   Status On-going   PT LONG TERM GOAL #4   Title pt improves TUG to 16" or less by 01/23/15   Status On-going               Plan - 01/02/15 1455    Clinical Impression Statement hip weakness and impaired posture along with fear of falling continue to contribute to impaired balance.  good performance today.   PT Next Visit Plan hip stability exercises, balance training as able        Problem List Patient Active Problem List   Diagnosis Date Noted  . Urinary incontinence 11/14/2014  . Insomnia 06/27/2014  . Dandruff 04/21/2013  . Allergic rhinitis 12/27/2012  . Vitamin D deficiency 02/27/2009  . GLAUCOMA 02/27/2009  . SCOLIOSIS 02/27/2009  . Chronic lymphocytic leukemia 02/09/2009  . DEGENERATIVE  DISC DISEASE, LUMBAR SPINE 02/09/2009  . SENILE OSTEOPOROSIS 02/09/2009  . ELEVATED BLOOD PRESSURE WITHOUT DIAGNOSIS OF HYPERTENSION 02/09/2009    Marveen Donlon PT, OCS 01/02/2015, 2:57 PM  [facility] [place], [place], [address] Phone: [phone]   Fax:  [phone]

## 2015-01-05 ENCOUNTER — Ambulatory Visit: Payer: Medicare Other | Admitting: Physical Therapy

## 2015-01-05 DIAGNOSIS — R269 Unspecified abnormalities of gait and mobility: Secondary | ICD-10-CM

## 2015-01-05 DIAGNOSIS — R262 Difficulty in walking, not elsewhere classified: Secondary | ICD-10-CM | POA: Diagnosis not present

## 2015-01-05 DIAGNOSIS — R29898 Other symptoms and signs involving the musculoskeletal system: Secondary | ICD-10-CM

## 2015-01-05 NOTE — Therapy (Signed)
[facility] [address]  [place], [place], [address] Phone: [phone]   Fax:  [phone]  Physical Therapy Treatment  Patient Details  Name: Alison Hammond MRN: [id number] Date of Birth: 02/03/1925 Referring Provider:  Debbrah Alar, NP  Encounter Date: 01/05/2015      PT End of Session - 01/05/15 1459    Visit Number 7   Number of Visits 12   Date for PT Re-Evaluation 01/24/15   PT Start Time 1455   PT Stop Time 1535   PT Time Calculation (min) 40 min      Past Medical History  Diagnosis Date  . Osteoporosis   . Chronic low back pain   . DDD (degenerative disc disease), lumbar   . Chronic lymphocytic leukemia   . Hx of adenomatous colonic polyps     no polyps in 1/08  . Hyperlipidemia   . Glaucoma   . Heart murmur   . History of blood transfusion     due to fracture of femur  . Insomnia 06/27/2014    Past Surgical History  Procedure Laterality Date  . Breast enhancement surgery  1978, 3382    with silicone  . Knee arthroscopy  1996    right  . Repair of crushed femur  1997  . Cataract extraction      right eye. Also has bilateral eye implants for distance and close up vision.  . Abdominal hysterectomy  2013    at [facility]    There were no vitals filed for this visit.  Visit Diagnosis:  Abnormality of gait  Difficulty walking  Weakness of both lower extremities      Subjective Assessment - 01/05/15 1457    Subjective Pt purchased new SPC and has it set to appropriate height.  States feels "much better"   Currently in Pain? No/denies            [facility] PT Assessment - 01/05/15 0001    Strength   Overall Strength Comments R Hip ABD 3-/5, L Hip ABD 2+/5      TODAY'S TREATMENT Seated One-Arm Row Green TB 20x each Seated Hip ABD Black TB 20x Seated Knee Flexion Black TB 15x each Seated LAQ 5# 15x each Standing on Foam Pad 8" Step Toe Tapping 10x each with SPC A and  CGA 10x each (difficult, pt fearful) Foam Pad Step-ups 10x with SPC A and CGA (pt fearful and over-responds to LOB) Side-Stepping on Foam Beam 3 Laps with SPC A in front of pt and CGA behind pt Bridge with March 4x2 each leg Side-Lying Clam ER Green TB 10x each Side-Lying Hip ABD 10x each with assist from PT (L Hip ABD MMT 2+/5 and R Hip ABD MMT 3-/5)                      PT Short Term Goals - 12/29/14 1533    PT SHORT TERM GOAL #1   Title pt independent with initial HEP by 12/26/14   Status Achieved           PT Long Term Goals - 01/05/15 1558    PT LONG TERM GOAL #1   Title pt scores 49/56 or better on Berg by 01/23/15   Status On-going   PT LONG TERM GOAL #2   Title pt reports not limiting activity due to fear of falling by 01/23/15   Status On-going   PT LONG TERM GOAL #3  Title pt able to ambulate with SPC while displaying appropriate foot clearance, stride length, along with more upright posture by 01/23/15   Status On-going   PT LONG TERM GOAL #4   Title pt improves TUG to 16" or less by 01/23/15   Status On-going               Plan - 01/05/15 1559    Clinical Impression Statement Pt with very weak B hip ABD.  We have been addressing in clinic and with HEP but still very weak.  Looking back at initial evaluation, there was an error in MMT assessment to B Hip ABD.   PT Next Visit Plan hip stability exercises, balance training as able        Problem List Patient Active Problem List   Diagnosis Date Noted  . Urinary incontinence 11/14/2014  . Insomnia 06/27/2014  . Dandruff 04/21/2013  . Allergic rhinitis 12/27/2012  . Vitamin D deficiency 02/27/2009  . GLAUCOMA 02/27/2009  . SCOLIOSIS 02/27/2009  . Chronic lymphocytic leukemia 02/09/2009  . DEGENERATIVE DISC DISEASE, LUMBAR SPINE 02/09/2009  . SENILE OSTEOPOROSIS 02/09/2009  . ELEVATED BLOOD PRESSURE WITHOUT DIAGNOSIS OF HYPERTENSION 02/09/2009    Braedyn Kauk PT, OCS 01/05/2015, 4:00  PM  [facility] [place], [place], [address] Phone: [phone]   Fax:  [phone]

## 2015-01-09 ENCOUNTER — Ambulatory Visit: Payer: Medicare Other | Admitting: Physical Therapy

## 2015-01-10 ENCOUNTER — Ambulatory Visit: Payer: Medicare Other | Admitting: Physical Therapy

## 2015-01-10 DIAGNOSIS — R262 Difficulty in walking, not elsewhere classified: Secondary | ICD-10-CM

## 2015-01-10 DIAGNOSIS — R269 Unspecified abnormalities of gait and mobility: Secondary | ICD-10-CM | POA: Diagnosis not present

## 2015-01-10 DIAGNOSIS — R29898 Other symptoms and signs involving the musculoskeletal system: Secondary | ICD-10-CM

## 2015-01-10 NOTE — Therapy (Signed)
[facility] [address]  [place], [place], [address] Phone: [phone]   Fax:  [phone]  Physical Therapy Treatment  Patient Details  Name: Alison Hammond MRN: [id number] Date of Birth: 11/04/1924 Referring Provider:  Debbrah Alar, NP  Encounter Date: 01/10/2015      PT End of Session - 01/10/15 1409    Visit Number 8   Number of Visits 12   Date for PT Re-Evaluation 01/24/15   PT Start Time 2637   PT Stop Time 1443   PT Time Calculation (min) 36 min      Past Medical History  Diagnosis Date  . Osteoporosis   . Chronic low back pain   . DDD (degenerative disc disease), lumbar   . Chronic lymphocytic leukemia   . Hx of adenomatous colonic polyps     no polyps in 1/08  . Hyperlipidemia   . Glaucoma   . Heart murmur   . History of blood transfusion     due to fracture of femur  . Insomnia 06/27/2014    Past Surgical History  Procedure Laterality Date  . Breast enhancement surgery  1978, 8588    with silicone  . Knee arthroscopy  1996    right  . Repair of crushed femur  1997  . Cataract extraction      right eye. Also has bilateral eye implants for distance and close up vision.  . Abdominal hysterectomy  2013    at [facility]    There were no vitals filed for this visit.  Visit Diagnosis:  Weakness of both lower extremities  Difficulty walking  Abnormality of gait      Subjective Assessment - 01/10/15 1408    Subjective States L hip sore past 2 days for unknown reason.   Currently in Pain? Yes   Pain Score 7    Pain Location Hip   Pain Orientation Left   Multiple Pain Sites No         TODAY'S TREATMENT TherEx - Bridge 15x Hooklying Hip ABD Black TB 2x20 Hooklying Hip ADD Black TB 20x Leg Press 15# 20x, 20# 15x Seated Knee Flexion Black TB 15x each Sit<->Stand transfers from plinth with no hands and focus on slow controlled descent 5x (all 5 quite  difficult) Seated LAQ 5# 20x each Standing Hip ABD Yellow TB at ankles 16x each (limited ROM due to weakness)                           PT Short Term Goals - 12/29/14 1533    PT SHORT TERM GOAL #1   Title pt independent with initial HEP by 12/26/14   Status Achieved           PT Long Term Goals - 01/05/15 1558    PT LONG TERM GOAL #1   Title pt scores 49/56 or better on Berg by 01/23/15   Status On-going   PT LONG TERM GOAL #2   Title pt reports not limiting activity due to fear of falling by 01/23/15   Status On-going   PT LONG TERM GOAL #3   Title pt able to ambulate with SPC while displaying appropriate foot clearance, stride length, along with more upright posture by 01/23/15   Status On-going   PT LONG TERM GOAL #4   Title pt improves TUG to 16" or less by 01/23/15   Status On-going  Plan - 01/10/15 1448    Clinical Impression Statement reduced intensity and limited standing exercises today due to c/o L hip pain.  Focus was on LE strengthening.  Overall B LE functional strength quite limited as noted with sit->stand training and MMT last week.  Added leg press (reluctant to do in the past due to machine so difficult to get in/out of) and whereas wt was very light it seemed beneficial and we will continue to include this in POC progressing as tolerated.    PT Next Visit Plan continue leg press, assess TUG and/or Berg goals   Consulted and Agree with Plan of Care Patient        Problem List Patient Active Problem List   Diagnosis Date Noted  . Urinary incontinence 11/14/2014  . Insomnia 06/27/2014  . Dandruff 04/21/2013  . Allergic rhinitis 12/27/2012  . Vitamin D deficiency 02/27/2009  . GLAUCOMA 02/27/2009  . SCOLIOSIS 02/27/2009  . Chronic lymphocytic leukemia 02/09/2009  . DEGENERATIVE DISC DISEASE, LUMBAR SPINE 02/09/2009  . SENILE OSTEOPOROSIS 02/09/2009  . ELEVATED BLOOD PRESSURE WITHOUT DIAGNOSIS OF HYPERTENSION  02/09/2009    Nga Rabon PT, OCS 01/10/2015, 2:55 PM  [facility] [address]  [place], [place], [address] Phone: [phone]   Fax:  [phone]

## 2015-01-12 ENCOUNTER — Ambulatory Visit: Payer: Medicare Other | Admitting: Physical Therapy

## 2015-01-12 DIAGNOSIS — R262 Difficulty in walking, not elsewhere classified: Secondary | ICD-10-CM | POA: Diagnosis not present

## 2015-01-12 DIAGNOSIS — R29898 Other symptoms and signs involving the musculoskeletal system: Secondary | ICD-10-CM

## 2015-01-12 DIAGNOSIS — R269 Unspecified abnormalities of gait and mobility: Secondary | ICD-10-CM | POA: Diagnosis not present

## 2015-01-12 NOTE — Therapy (Signed)
[facility] [address]  [place], [place], [address] Phone: [phone]   Fax:  [phone]  Physical Therapy Treatment  Patient Details  Name: Alison Hammond MRN: [id number] Date of Birth: 1924/09/30 Referring Provider:  Debbrah Alar, NP  Encounter Date: 01/12/2015      PT End of Session - 01/12/15 1406    Visit Number 9   Number of Visits 12   Date for PT Re-Evaluation 01/24/15   PT Start Time 5188   PT Stop Time 1404   PT Time Calculation (min) 46 min      Past Medical History  Diagnosis Date  . Osteoporosis   . Chronic low back pain   . DDD (degenerative disc disease), lumbar   . Chronic lymphocytic leukemia   . Hx of adenomatous colonic polyps     no polyps in 1/08  . Hyperlipidemia   . Glaucoma   . Heart murmur   . History of blood transfusion     due to fracture of femur  . Insomnia 06/27/2014    Past Surgical History  Procedure Laterality Date  . Breast enhancement surgery  1978, 4166    with silicone  . Knee arthroscopy  1996    right  . Repair of crushed femur  1997  . Cataract extraction      right eye. Also has bilateral eye implants for distance and close up vision.  . Abdominal hysterectomy  2013    at [facility]    There were no vitals filed for this visit.  Visit Diagnosis:  Weakness of both lower extremities  Difficulty walking  Abnormality of gait      Subjective Assessment - 01/12/15 1326    Subjective States L hip feels fine today.   Currently in Pain? No/denies            [facility] PT Assessment - 01/12/15 0001    Berg Balance Test   Sit to Stand Able to stand without using hands and stabilize independently   Standing Unsupported Able to stand safely 2 minutes   Sitting with Back Unsupported but Feet Supported on Floor or Stool Able to sit safely and securely 2 minutes   Stand to Sit Sits safely with minimal use of hands   Transfers Able to transfer  safely, minor use of hands   Standing Unsupported with Eyes Closed Able to stand 10 seconds safely   Standing Ubsupported with Feet Together Able to place feet together independently and stand for 1 minute with supervision   From Standing, Reach Forward with Outstretched Arm Can reach forward >12 cm safely (5")   From Standing Position, Pick up Object from Colp to pick up shoe safely and easily   From Standing Position, Turn to Look Behind Over each Shoulder Turn sideways only but maintains balance   Turn 360 Degrees Able to turn 360 degrees safely but slowly   Standing Unsupported, Alternately Place Feet on Step/Stool Able to complete >2 steps/needs minimal assist   Standing Unsupported, One Foot in Front Able to plae foot ahead of the other independently and hold 30 seconds   Standing on One Leg Tries to lift leg/unable to hold 3 seconds but remains standing independently   Total Score 43      TODAY'S TREATMENT Standing on Foam Pad 8" Step Toe Tapping 10x each without but with CGA 10x each (difficult, pt fearful) Foam Pad Step-ups 10x with SPC A and CGA (pt  fearful and over-responds to LOB) 8" Step toe tapping 10x with SBA but required CGA to initiate activity Side-Stepping on Foam Beam 3 Laps with SPC A in front of pt and CGA behind pt Berg Assessment Seated One-Arm Row Green TB 20x each Seated Hip ABD Black TB 2x20 Seated Knee Flexion Black TB 15x each Sit->Stand 6x without UE assist                     PT Short Term Goals - 12/29/14 1533    PT SHORT TERM GOAL #1   Title pt independent with initial HEP by 12/26/14   Status Achieved           PT Long Term Goals - 01/05/15 1558    PT LONG TERM GOAL #1   Title pt scores 49/56 or better on Berg by 01/23/15   Status On-going   PT LONG TERM GOAL #2   Title pt reports not limiting activity due to fear of falling by 01/23/15   Status On-going   PT LONG TERM GOAL #3   Title pt able to ambulate with SPC while  displaying appropriate foot clearance, stride length, along with more upright posture by 01/23/15   Status On-going   PT LONG TERM GOAL #4   Title pt improves TUG to 16" or less by 01/23/15   Status On-going               Plan - 01/12/15 1517    Clinical Impression Statement some improvement in [place] but chief limitation is still pt's fear and overreaction to sense of LOB.  will continue to progress as able.   PT Next Visit Plan continue leg press, assess TUG?   Consulted and Agree with Plan of Care Patient        Problem List Patient Active Problem List   Diagnosis Date Noted  . Urinary incontinence 11/14/2014  . Insomnia 06/27/2014  . Dandruff 04/21/2013  . Allergic rhinitis 12/27/2012  . Vitamin D deficiency 02/27/2009  . GLAUCOMA 02/27/2009  . SCOLIOSIS 02/27/2009  . Chronic lymphocytic leukemia 02/09/2009  . DEGENERATIVE DISC DISEASE, LUMBAR SPINE 02/09/2009  . SENILE OSTEOPOROSIS 02/09/2009  . ELEVATED BLOOD PRESSURE WITHOUT DIAGNOSIS OF HYPERTENSION 02/09/2009    Biruk Troia PT, OCS 01/12/2015, 3:18 PM  [facility] [place], [place], [address] Phone: [phone]   Fax:  [phone]

## 2015-01-16 ENCOUNTER — Ambulatory Visit: Payer: Medicare Other | Attending: Family | Admitting: Physical Therapy

## 2015-01-16 DIAGNOSIS — R262 Difficulty in walking, not elsewhere classified: Secondary | ICD-10-CM | POA: Diagnosis not present

## 2015-01-16 DIAGNOSIS — R269 Unspecified abnormalities of gait and mobility: Secondary | ICD-10-CM

## 2015-01-16 DIAGNOSIS — R29898 Other symptoms and signs involving the musculoskeletal system: Secondary | ICD-10-CM

## 2015-01-16 NOTE — Therapy (Signed)
[facility] [address]  [place], [place], [address] Phone: [phone]   Fax:  [phone]  Physical Therapy Treatment  Patient Details  Name: Alison Hammond MRN: [id number] Date of Birth: 1925-06-23 Referring Provider:  Debbrah Alar, NP  Encounter Date: 01/16/2015      PT End of Session - 01/16/15 1409    Visit Number 10   Number of Visits 12   Date for PT Re-Evaluation 01/24/15   PT Start Time 4888   PT Stop Time 9169   PT Time Calculation (min) 38 min      Past Medical History  Diagnosis Date  . Osteoporosis   . Chronic low back pain   . DDD (degenerative disc disease), lumbar   . Chronic lymphocytic leukemia   . Hx of adenomatous colonic polyps     no polyps in 1/08  . Hyperlipidemia   . Glaucoma   . Heart murmur   . History of blood transfusion     due to fracture of femur  . Insomnia 06/27/2014    Past Surgical History  Procedure Laterality Date  . Breast enhancement surgery  1978, 4503    with silicone  . Knee arthroscopy  1996    right  . Repair of crushed femur  1997  . Cataract extraction      right eye. Also has bilateral eye implants for distance and close up vision.  . Abdominal hysterectomy  2013    at [facility]    There were no vitals filed for this visit.  Visit Diagnosis:  Weakness of both lower extremities  Difficulty walking  Abnormality of gait      Subjective Assessment - 01/16/15 1408    Subjective No pain today or since last treatment, went to Bends, Bones, and Balance class earier today without pain.  Class includes seated and standing exercises. She states she continues to perform side-stepping in halls as part of HEP.   Currently in Pain? No/denies            [facility] PT Assessment - 01/16/15 0001    Observation/Other Assessments   Focus on Therapeutic Outcomes (FOTO)  54% limitation (however she reports some improvement and Berg score has improved)     TODAY'S TREATMENT: TherEx - Bridge with March 10x Hooklying Hip ABD Black TB 20x Supine Alt SLR with Yellow TB at ankles 6x each Leg Press 20# 2x20 6" ALT Step-up with B Pole A 6x each (attempted with SPC but unable) Attempted 4" side-step up with L LE but noted hip pain with first rep so stopped (also could palpate significant crepitus likely due to OA in L hip during this) 8" Step toe tapping 2x10 with SPC A (second set with decreased without SPC assist)           PT Short Term Goals - 12/29/14 1533    PT SHORT TERM GOAL #1   Title pt independent with initial HEP by 12/26/14   Status Achieved           PT Long Term Goals - 01/05/15 1558    PT LONG TERM GOAL #1   Title pt scores 49/56 or better on Berg by 01/23/15   Status On-going   PT LONG TERM GOAL #2   Title pt reports not limiting activity due to fear of falling by 01/23/15   Status On-going   PT LONG TERM GOAL #3   Title pt able to ambulate with  SPC while displaying appropriate foot clearance, stride length, along with more upright posture by 01/23/15   Status On-going   PT LONG TERM GOAL #4   Title pt improves TUG to 16" or less by 01/23/15   Status On-going               Plan - Jan 17, 2015 1500    Clinical Impression Statement pt did not perform well with standing exercise for some reason today.  She had more fear/anxiety about step-ups and toe tapping exercises. Could palpate crepitus in L hip during CGA with some of today's exercises likely due to OA in this hip.  Pt has made some progress but limited progress and still quite impaired.   PT Next Visit Plan begin with standing exercises; continue leg press, will require a renewal in 2 visits.   Consulted and Agree with Plan of Care Patient          G-Codes - 01-17-2015 1455    Functional Assessment Tool Used FOTO 54% limitation   Functional Limitation Mobility: Walking and moving around   Mobility: Walking and Moving Around Current Status [phone]) At least  40 percent but less than 60 percent impaired, limited or restricted   Mobility: Walking and Moving Around Goal Status [phone]) At least 20 percent but less than 40 percent impaired, limited or restricted      Problem List Patient Active Problem List   Diagnosis Date Noted  . Urinary incontinence 11/14/2014  . Insomnia 06/27/2014  . Dandruff 04/21/2013  . Allergic rhinitis 12/27/2012  . Vitamin D deficiency 02/27/2009  . GLAUCOMA 02/27/2009  . SCOLIOSIS 02/27/2009  . Chronic lymphocytic leukemia 02/09/2009  . DEGENERATIVE DISC DISEASE, LUMBAR SPINE 02/09/2009  . SENILE OSTEOPOROSIS 02/09/2009  . ELEVATED BLOOD PRESSURE WITHOUT DIAGNOSIS OF HYPERTENSION 02/09/2009    Dareth Andrew PT, OCS 2015-01-17, 3:04 PM  [facility] [place], [place], [address] Phone: [phone]   Fax:  [phone]

## 2015-01-19 ENCOUNTER — Ambulatory Visit: Payer: Medicare Other | Admitting: Physical Therapy

## 2015-01-23 ENCOUNTER — Ambulatory Visit: Payer: Medicare Other | Admitting: Physical Therapy

## 2015-01-23 DIAGNOSIS — R269 Unspecified abnormalities of gait and mobility: Secondary | ICD-10-CM

## 2015-01-23 DIAGNOSIS — R262 Difficulty in walking, not elsewhere classified: Secondary | ICD-10-CM | POA: Diagnosis not present

## 2015-01-23 DIAGNOSIS — R29898 Other symptoms and signs involving the musculoskeletal system: Secondary | ICD-10-CM

## 2015-01-23 NOTE — Therapy (Signed)
[facility] [address]  [place], [place], [address] Phone: [phone]   Fax:  [phone]  Physical Therapy Treatment  Patient Details  Name: Alison Hammond MRN: [id number] Date of Birth: [birth year] Referring Provider:  Debbrah Alar, NP  Encounter Date: 01/23/2015      PT End of Session - 01/23/15 1407    Visit Number 11   Number of Visits 23   Date for PT Re-Evaluation 03/06/15   PT Start Time 1540   PT Stop Time 1440   PT Time Calculation (min) 35 min      Past Medical History  Diagnosis Date  . Osteoporosis   . Chronic low back pain   . DDD (degenerative disc disease), lumbar   . Chronic lymphocytic leukemia   . Hx of adenomatous colonic polyps     no polyps in 1/08  . Hyperlipidemia   . Glaucoma   . Heart murmur   . History of blood transfusion     due to fracture of femur  . Insomnia 06/27/2014    Past Surgical History  Procedure Laterality Date  . Breast enhancement surgery  1978, 0867    with silicone  . Knee arthroscopy  1996    right  . Repair of crushed femur  1997  . Cataract extraction      right eye. Also has bilateral eye implants for distance and close up vision.  . Abdominal hysterectomy  2013    at [facility]    There were no vitals filed for this visit.  Visit Diagnosis:  Weakness of both lower extremities - Plan: PT plan of care cert/re-cert  Abnormality of gait - Plan: PT plan of care cert/re-cert  Difficulty walking - Plan: PT plan of care cert/re-cert      Subjective Assessment - 01/23/15 1452    Subjective No pain at start of treatment.  Noted short duration R knee pain during some of the step-up exercises today (typically notes intermittent L hip pain with this) but returned to pain-free by end of session.  She states she continues with HEP.   Currently in Pain? No/denies        TODAY'S TREATMENT: TherEx -  8" Step Toe Tapping with SPC A 10x  each. 4" Step-up with SPC A 10x (holds can in R UE and is unable to perform with R LE today but was able to perform with L) Standing on Foam Pad 8" step toe tapping with SPC A 10x each 6" step BW stepdown 5x each with SPC A - pt was unable to step up to 4" step with R LE earlier but if started with both feet on step was able to step down then back up with 6" step. Leg Press 25#  2x20 DL Knee Flexion Machine 20# 2x10 Standing Heel/Toe Raise 20x Seated Hip ABD Black TB 20x         PT Short Term Goals - 12/29/14 1533    PT SHORT TERM GOAL #1   Title pt independent with initial HEP by 12/26/14   Status Achieved           PT Long Term Goals - 01/23/15 1443    PT LONG TERM GOAL #1   Title pt scores 49/56 or better on Berg by 01/23/15  has improved from 40 to 43 so far   Status On-going   PT LONG TERM GOAL #2   Title pt reports not limiting activity due  to fear of falling by 01/23/15  still fearful but does improve while here during treatments   Status On-going   PT LONG TERM GOAL #3   Title pt able to ambulate with SPC while displaying appropriate foot clearance, stride length, along with more upright posture by 01/23/15   Status On-going   PT LONG TERM GOAL #4   Status On-going               Plan - 01/23/15 1445    Clinical Impression Statement pt continues to show high level of anxiety and over reaction to loss of balance (or sensation of loss of balance) with balance training but this improves after several repetitions of balance training exercises; however, then she will either become fatigued or begin noting hip or knee pain.  This pain is short duration and only noted while performing some standing activities, but does limit progress.  She is performing HEP and enjoys the strengthening portions of her treatments here.  Overall, she displays mild improvement to date and still with significant hip ABD weakness and impaired balance.  My initial goal of 49/56 on the Berg may not  be achievable, but I'm confident we can improve beyond our current level.  Alison Hammond has attended 11 treatments in the past 6 weeks and she wants to continue with PT with hopes of progressing further.  I'm recommending another 6 weeks at 2x/wk; however, if her progress plateaus I will discharge before that time.   PT Next Visit Plan continue balance training at start of treatment while still fresh then strengthening exercises   Consulted and Agree with Plan of Care Patient        Problem List Patient Active Problem List   Diagnosis Date Noted  . Urinary incontinence 11/14/2014  . Insomnia 06/27/2014  . Dandruff 04/21/2013  . Allergic rhinitis 12/27/2012  . Vitamin D deficiency 02/27/2009  . GLAUCOMA 02/27/2009  . SCOLIOSIS 02/27/2009  . Chronic lymphocytic leukemia 02/09/2009  . DEGENERATIVE DISC DISEASE, LUMBAR SPINE 02/09/2009  . SENILE OSTEOPOROSIS 02/09/2009  . ELEVATED BLOOD PRESSURE WITHOUT DIAGNOSIS OF HYPERTENSION 02/09/2009    Adayah Arocho PT, OCS 01/23/2015, 2:56 PM  [facility] [place], [place], [address] Phone: [phone]   Fax:  [phone]

## 2015-01-26 ENCOUNTER — Ambulatory Visit: Payer: Medicare Other | Admitting: Physical Therapy

## 2015-01-26 DIAGNOSIS — R262 Difficulty in walking, not elsewhere classified: Secondary | ICD-10-CM

## 2015-01-26 DIAGNOSIS — R269 Unspecified abnormalities of gait and mobility: Secondary | ICD-10-CM | POA: Diagnosis not present

## 2015-01-26 DIAGNOSIS — R29898 Other symptoms and signs involving the musculoskeletal system: Secondary | ICD-10-CM

## 2015-01-26 NOTE — Therapy (Signed)
[facility] [address]  [place], [place], [address] Phone: [phone]   Fax:  [phone]  Physical Therapy Treatment  Patient Details  Name: Alison Hammond MRN: [id number] Date of Birth: Oct 04, 1924 Referring Provider:  Debbrah Alar, NP  Encounter Date: 01/26/2015      PT End of Session - 01/26/15 1414    Visit Number 12   Number of Visits 23   Date for PT Re-Evaluation 03/06/15   PT Start Time 7939   PT Stop Time 1443   PT Time Calculation (min) 29 min      Past Medical History  Diagnosis Date  . Osteoporosis   . Chronic low back pain   . DDD (degenerative disc disease), lumbar   . Chronic lymphocytic leukemia   . Hx of adenomatous colonic polyps     no polyps in 1/08  . Hyperlipidemia   . Glaucoma   . Heart murmur   . History of blood transfusion     due to fracture of femur  . Insomnia 06/27/2014    Past Surgical History  Procedure Laterality Date  . Breast enhancement surgery  1978, 0300    with silicone  . Knee arthroscopy  1996    right  . Repair of crushed femur  1997  . Cataract extraction      right eye. Also has bilateral eye implants for distance and close up vision.  . Abdominal hysterectomy  2013    at [facility]    There were no vitals filed for this visit.  Visit Diagnosis:  Weakness of both lower extremities  Abnormality of gait  Difficulty walking      Subjective Assessment - 01/26/15 1453    Subjective no pain but states hips and knees feel stiff today   Currently in Pain? No/denies         TODAY'S TREATMENT TherEx - Seated Hip ABD Black TB 20x Staggered Standing One Arm Row Double Green TB 2x12 each (strength and balance) Mini Squat standing Low Row Blue TB 2x12 Standing on Foam Pad with B Shoulder Flexion with green heavy ball 10x Standing on Foam Pad with EC 10" - fearful, no LOB however Leg Press 15# 20x (attempted 25# as performed last  treatment but pt c/o B knee pain so we decreased wt) Bridge 15x Side-Lying Hip ABD 10x         PT Short Term Goals - 12/29/14 1533    PT SHORT TERM GOAL #1   Title pt independent with initial HEP by 12/26/14   Status Achieved           PT Long Term Goals - 01/23/15 1443    PT LONG TERM GOAL #1   Title pt scores 49/56 or better on Berg by 01/23/15  has improved from 40 to 43 so far   Status On-going   PT LONG TERM GOAL #2   Title pt reports not limiting activity due to fear of falling by 01/23/15  still fearful but does improve while here during treatments   Status On-going   PT LONG TERM GOAL #3   Title pt able to ambulate with SPC while displaying appropriate foot clearance, stride length, along with more upright posture by 01/23/15   Status On-going   PT LONG TERM GOAL #4   Status On-going               Plan - 01/26/15 1454    Clinical Impression  Statement perfomed balance training while pt performed upper body exercises working on posture correction.  This went very well in that pt less fearful and performed better.   PT Next Visit Plan continue balance training incorporated with other exercises at start of treatment while still fresh then strengthening exercises   Consulted and Agree with Plan of Care Patient        Problem List Patient Active Problem List   Diagnosis Date Noted  . Urinary incontinence 11/14/2014  . Insomnia 06/27/2014  . Dandruff 04/21/2013  . Allergic rhinitis 12/27/2012  . Vitamin D deficiency 02/27/2009  . GLAUCOMA 02/27/2009  . SCOLIOSIS 02/27/2009  . Chronic lymphocytic leukemia 02/09/2009  . DEGENERATIVE DISC DISEASE, LUMBAR SPINE 02/09/2009  . SENILE OSTEOPOROSIS 02/09/2009  . ELEVATED BLOOD PRESSURE WITHOUT DIAGNOSIS OF HYPERTENSION 02/09/2009    Euclid Cassetta PT, OCS 01/26/2015, 2:56 PM  [facility] [place], [place], [address] Phone: [phone]    Fax:  [phone]

## 2015-01-30 ENCOUNTER — Ambulatory Visit: Payer: Medicare Other | Admitting: Physical Therapy

## 2015-02-02 ENCOUNTER — Ambulatory Visit: Payer: Medicare Other | Admitting: Physical Therapy

## 2015-02-02 DIAGNOSIS — R29898 Other symptoms and signs involving the musculoskeletal system: Secondary | ICD-10-CM

## 2015-02-02 DIAGNOSIS — R262 Difficulty in walking, not elsewhere classified: Secondary | ICD-10-CM | POA: Diagnosis not present

## 2015-02-02 DIAGNOSIS — R269 Unspecified abnormalities of gait and mobility: Secondary | ICD-10-CM

## 2015-02-02 NOTE — Therapy (Addendum)
[facility] [address]  [place], [place], [address] Phone: [phone]   Fax:  [phone]  Physical Therapy Treatment  Patient Details  Name: Alison Hammond MRN: [id number] Date of Birth: 10-23-1924 Referring Provider:  Debbrah Alar, NP  Encounter Date: 02/02/2015      PT End of Session - 02/02/15 1411    Visit Number 13   Number of Visits 23   Date for PT Re-Evaluation 03/06/15   PT Start Time 1409   PT Stop Time 1500   PT Time Calculation (min) 51 min      Past Medical History  Diagnosis Date  . Osteoporosis   . Chronic low back pain   . DDD (degenerative disc disease), lumbar   . Chronic lymphocytic leukemia   . Hx of adenomatous colonic polyps     no polyps in 1/08  . Hyperlipidemia   . Glaucoma   . Heart murmur   . History of blood transfusion     due to fracture of femur  . Insomnia 06/27/2014    Past Surgical History  Procedure Laterality Date  . Breast enhancement surgery  1978, 3710    with silicone  . Knee arthroscopy  1996    right  . Repair of crushed femur  1997  . Cataract extraction      right eye. Also has bilateral eye implants for distance and close up vision.  . Abdominal hysterectomy  2013    at [facility]    There were no vitals filed for this visit.  Visit Diagnosis:  Weakness of both lower extremities  Abnormality of gait  Difficulty walking      Subjective Assessment - 02/02/15 1410    Subjective states is feeling pretty good today   Currently in Pain? No/denies          TODAY'S TREATMENT TherEx - Seated Hip ABD Black TB 20x Staggered Standing One Arm Row Double Blue TB 2x12 each (strength and balance) Mini Squat standing on Foam Pad with Low Row Black TB 2x12 Standing on Foam Pad with EC 20" Standing on Foam Pad with C-spine B Rotation 3x each Leg Press 20# 2x20 Seated P-ball (65cm) Green 0.5KB ball B UE Flexion 5x, B UE diagonals 5x  each Seated P-ball (65cm) arms 90 ABD with R/L c-spine rotation 3x each Side-Lying Hip ABD 12x (assist by PT to achieve full ROM) Bridge 15x        PT Short Term Goals - 12/29/14 1533    PT SHORT TERM GOAL #1   Title pt independent with initial HEP by 12/26/14   Status Achieved           PT Long Term Goals - 02/02/15 1443    PT LONG TERM GOAL #1   Title pt scores 49/56 or better on Berg by 01/23/15   Status On-going   PT LONG TERM GOAL #2   Title pt reports not limiting activity due to fear of falling by 01/23/15   Status On-going   PT LONG TERM GOAL #3   Title pt able to ambulate with SPC while displaying appropriate foot clearance, stride length, along with more upright posture by 01/23/15   Status On-going   PT LONG TERM GOAL #4   Title pt improves TUG to 16" or less by 01/23/15  has progressed to 20"   Status On-going               Plan -  02/02/15 1539    Clinical Impression Statement good improvement in TUG score (33" initial score, down to 20" today).  Pt states she enjoys current POC with balance training incorporated with other activities.  She performed better than I expected with seated PBall exercises today.   PT Next Visit Plan continue balance training incorporated with other exercises at start of treatment while still fresh then strengthening exercises   Consulted and Agree with Plan of Care Patient        Problem List Patient Active Problem List   Diagnosis Date Noted  . Urinary incontinence 11/14/2014  . Insomnia 06/27/2014  . Dandruff 04/21/2013  . Allergic rhinitis 12/27/2012  . Vitamin D deficiency 02/27/2009  . GLAUCOMA 02/27/2009  . SCOLIOSIS 02/27/2009  . Chronic lymphocytic leukemia 02/09/2009  . DEGENERATIVE DISC DISEASE, LUMBAR SPINE 02/09/2009  . SENILE OSTEOPOROSIS 02/09/2009  . ELEVATED BLOOD PRESSURE WITHOUT DIAGNOSIS OF HYPERTENSION 02/09/2009    Alison Hammond PT, OCS 02/02/2015, 3:42 PM  [facility] [address]  [place], [place], [address] Phone: [phone]   Fax:  [phone]     PHYSICAL THERAPY DISCHARGE SUMMARY  Visits from Start of Care: 13  Current functional level related to goals / functional outcomes: Some progress but continued impaired gait and balance   Remaining deficits: Impaired gait and balance, LBP   Education / Equipment: HEP, posture correction Plan: Patient agrees to discharge.  Patient goals were not met. Patient is being discharged due to the patient's request.  ?????       Alison Hammond requested discharge from PT in June 2016 due to feeling as though her progress had plateaued.  Alison Hammond PT, OCS 04/03/2015 8:35 AM

## 2015-02-03 DIAGNOSIS — M419 Scoliosis, unspecified: Secondary | ICD-10-CM | POA: Diagnosis not present

## 2015-02-03 DIAGNOSIS — G8929 Other chronic pain: Secondary | ICD-10-CM | POA: Diagnosis not present

## 2015-02-03 DIAGNOSIS — M545 Low back pain: Secondary | ICD-10-CM | POA: Diagnosis not present

## 2015-02-06 ENCOUNTER — Telehealth: Payer: Self-pay | Admitting: Family

## 2015-02-06 ENCOUNTER — Ambulatory Visit: Payer: Medicare Other | Admitting: Physical Therapy

## 2015-02-06 NOTE — Telephone Encounter (Signed)
Caller name: Estellar Relation to pt: self Call back number: [phone] Pharmacy:  Reason for call:   Patient states that she has been to the spine dr and he felt like patient needed an xray. Would like Melissa to order this.

## 2015-02-06 NOTE — Telephone Encounter (Signed)
Spoke with pt. She states she saw [facility] for her back problems and he is wanting to have pt do an xray before determining if pt will need an MRI. Pt is wanting [place] to order xray. Advised pt to call Dr Thereasa Parkin and request that they fax his office notes and recommendation to [place] as we did not make this referral. Pt scheduled appt originally. Awaiting fax.

## 2015-02-06 NOTE — Telephone Encounter (Signed)
Agree.  Need to see records.

## 2015-02-09 ENCOUNTER — Ambulatory Visit: Payer: Medicare Other | Admitting: Physical Therapy

## 2015-02-14 ENCOUNTER — Ambulatory Visit (HOSPITAL_BASED_OUTPATIENT_CLINIC_OR_DEPARTMENT_OTHER)
Admission: RE | Admit: 2015-02-14 | Discharge: 2015-02-14 | Disposition: A | Discharge status: Home / Self Care | Payer: Medicare Other | Source: Ambulatory Visit | Attending: Family | Admitting: Family

## 2015-02-14 ENCOUNTER — Ambulatory Visit (INDEPENDENT_AMBULATORY_CARE_PROVIDER_SITE_OTHER): Payer: Medicare Other | Admitting: Family

## 2015-02-14 ENCOUNTER — Encounter: Payer: Self-pay | Admitting: Family

## 2015-02-14 VITALS — BP 122/65 | HR 84 | Temp 98.3°F | Resp 18 | Ht 62.0 in | Wt 123.0 lb

## 2015-02-14 DIAGNOSIS — M8588 Other specified disorders of bone density and structure, other site: Secondary | ICD-10-CM | POA: Diagnosis not present

## 2015-02-14 DIAGNOSIS — M4316 Spondylolisthesis, lumbar region: Secondary | ICD-10-CM | POA: Insufficient documentation

## 2015-02-14 DIAGNOSIS — M4856XA Collapsed vertebra, not elsewhere classified, lumbar region, initial encounter for fracture: Secondary | ICD-10-CM | POA: Diagnosis not present

## 2015-02-14 DIAGNOSIS — J309 Allergic rhinitis, unspecified: Secondary | ICD-10-CM | POA: Diagnosis not present

## 2015-02-14 DIAGNOSIS — I700 Atherosclerosis of aorta: Secondary | ICD-10-CM | POA: Insufficient documentation

## 2015-02-14 DIAGNOSIS — R300 Dysuria: Secondary | ICD-10-CM | POA: Diagnosis not present

## 2015-02-14 DIAGNOSIS — M545 Low back pain, unspecified: Secondary | ICD-10-CM | POA: Insufficient documentation

## 2015-02-14 LAB — POCT URINALYSIS DIPSTICK

## 2015-02-14 NOTE — Progress Notes (Signed)
Subjective:    Patient ID: Alison Hammond, female    DOB: Dec 25, 1924, 81 y.o.   MRN: [id number]  HPI  Alison Hammond is an 81 yr old female who presents today requesting x ray of her lumbar spine. She saw Dr. Thereasa Parkin who recommended that she complete a back x ray. He told her to come see [place] to complete.   The patient reports + post nasal drip.  Not currently not using claritin or flonase.   Review of Systems    see HPI  Past Medical History  Diagnosis Date  . Osteoporosis   . Chronic low back pain   . DDD (degenerative disc disease), lumbar   . Chronic lymphocytic leukemia   . Hx of adenomatous colonic polyps     no polyps in 1/08  . Hyperlipidemia   . Glaucoma   . Heart murmur   . History of blood transfusion     due to fracture of femur  . Insomnia 06/27/2014    History   Social History  . Marital Status: Single    Spouse Name: N/A  . Number of Children: 1  . Years of Education: N/A   Occupational History  .     Social History Main Topics  . Smoking status: Never Smoker   . Smokeless tobacco: Never Used  . Alcohol Use: 4.2 oz/week    7 Glasses of wine per week     Comment: 1 glass wine daily  . Drug Use: No  . Sexual Activity: Not on file   Other Topics Concern  . 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 Breast enhancement surgery  1978, 5465    with silicone  . Knee arthroscopy  1996    right  . Repair of crushed femur  1997  . Cataract extraction      right eye. Also has bilateral eye implants for distance and close up vision.  . Abdominal hysterectomy  2013    at [place] History  Problem Relation Age of Onset  . Colon cancer Mother     Died from colon caner   .  Esophageal cancer Father   . Arthritis Father   . Heart disease Father   . Fibromyalgia Daughter   . Stroke Maternal Grandmother     No Known Allergies  Current Outpatient Prescriptions on File Prior to Visit  Medication Sig Dispense Refill  . aspirin 81 MG tablet Take 81 mg by mouth daily.      . Calcium-Magnesium-Vitamin D (CALCIUM MAGNESIUM PO) Take 1 tablet by mouth daily.     . Coenzyme Q10 (COQ10 PO) Take 2 tablets by mouth daily.    . fluticasone (FLONASE) 50 MCG/ACT nasal spray Place 2 sprays into the nose daily. 16 g 3  . latanoprost (XALATAN) 0.005 % ophthalmic solution Place 1 drop into the right eye daily. 2.5 mL 2  . loratadine (CLARITIN) 10 MG tablet Take 10 mg by mouth daily as needed for allergies.    . naproxen sodium (ANAPROX) 220 MG tablet Take 220 mg by mouth daily.    . Omega-3 Fatty Acids (FISH OIL)  1000 MG CAPS Take 1 capsule by mouth daily.     Marland Kitchen tolterodine (DETROL) 2 MG tablet Take 1 tablet (2 mg total) by mouth 2 (two) times daily. 60 tablet 2  . Vitamin D, Ergocalciferol, (DRISDOL) 50000 UNITS CAPS capsule Take 1 capsule (50,000 Units total) by mouth every 7 (seven) days. 12 capsule 0  . zolpidem (AMBIEN CR) 6.25 MG CR tablet Take 1 tablet (6.25 mg total) by mouth at bedtime as needed. 30 tablet 0   No current facility-administered medications on file prior to visit.    BP 122/65 mmHg  Pulse 84  Temp(Src) 98.3 F (36.8 C) (Oral)  Resp 18  Ht 5\' 2"  (1.575 m)  Wt 123 lb (55.792 kg)  BMI 22.49 kg/m2  SpO2 98%    Objective:   Physical Exam  Constitutional: She is oriented to person, place, and time. She appears well-developed and well-nourished.  Cardiovascular: Normal rate, regular rhythm and normal heart sounds.   No murmur heard. Pulmonary/Chest: Effort normal and breath sounds normal. No respiratory distress. She has no wheezes.  Neurological: She is alert and oriented to person, place, and time.  Psychiatric: She has a normal mood and affect.  Her behavior is normal. Judgment and thought content normal.          Assessment & Plan:  Please note- UA was entered in error by CMA (wrong patient), pt denies dysuria.  Will request that charges be removed.

## 2015-02-14 NOTE — Assessment & Plan Note (Signed)
Advised pt to resume claritin. If not improvement on claritin, add flonase.

## 2015-02-14 NOTE — Assessment & Plan Note (Signed)
Will obtain x ray of the lumbar spine, and plan to forward results to her specialist.

## 2015-02-14 NOTE — Progress Notes (Signed)
Pre visit review using our clinic review tool, if applicable. No additional management support is needed unless otherwise documented below in the visit note. 

## 2015-02-14 NOTE — Patient Instructions (Addendum)
Complete x ray on the first floor.  Start claritin once daily for post nasal drip. If symptoms do not improve- you can also add flonase 2 sprays each nostril once daily.

## 2015-02-16 ENCOUNTER — Telehealth: Payer: Self-pay | Admitting: *Deleted

## 2015-02-16 NOTE — Telephone Encounter (Signed)
-----   Message from Debbrah Alar, NP sent at 02/15/2015  2:39 PM EDT ----- Please forward copy to Her spine specialist Dr. Thereasa Parkin?  Let pt know that x ray shows old compression fracture lower lumbar spine, some bone thinning. We will forward report to her specialist, however she will need to request CD to bring to him.

## 2015-02-16 NOTE — Telephone Encounter (Signed)
Result faxed to Dr Thereasa Parkin [phone]).  Disc was requested and Hoyle Sauer in imaging will have it ready for pick up.  Left detailed message on cell# re: below recommendation and to pick up copy of disc from radiology and to call if any questions.

## 2015-02-22 DIAGNOSIS — M545 Low back pain: Secondary | ICD-10-CM | POA: Diagnosis not present

## 2015-02-22 DIAGNOSIS — G8929 Other chronic pain: Secondary | ICD-10-CM | POA: Diagnosis not present

## 2015-02-22 NOTE — Telephone Encounter (Signed)
Pt called stating she never received below message. Reviewed below recommendation and pt voices understanding.

## 2015-02-23 ENCOUNTER — Telehealth: Payer: Self-pay | Admitting: Family

## 2015-02-23 NOTE — Telephone Encounter (Signed)
Caller name: Vonda Antigua  Relation to pt: daughter  Call back number: [phone]   Reason for call:  Pt states Dr. Sherlean Foot spine surgeon from [facility] and pt would like x ray results mailed to home address. Please advise

## 2015-02-24 NOTE — Telephone Encounter (Signed)
Results were already forwarded to Dr Thereasa Parkin. Copy mailed to pt.

## 2015-02-28 ENCOUNTER — Telehealth: Payer: Self-pay | Admitting: *Deleted

## 2015-02-28 DIAGNOSIS — M5416 Radiculopathy, lumbar region: Secondary | ICD-10-CM

## 2015-02-28 NOTE — Telephone Encounter (Signed)
Correction to below documentation. Request forwarded to PA, Hassell Done.  Please advise.

## 2015-02-28 NOTE — Telephone Encounter (Signed)
Caller name: Augustin Coupe  Relation to pt: daughter  Call back number: [phone]    Reason for call:  Dr. Thereasa Parkin is requesting pt to have MRI. Requesting orders please advise were pt should go to have MRI.

## 2015-02-28 NOTE — Telephone Encounter (Signed)
Received office note from Dr Thereasa Parkin' office and forwarded to Dr Etter Sjogren for review / MRI order.  Please advise.

## 2015-02-28 NOTE — Telephone Encounter (Signed)
Spoke with [place] in Dr Thereasa Parkin office [phone]) and requested MRI order and office notes. She will forward request to nurse to complete or call me back.  Oneta Rack Signed Service date: 02/28/2015 10:53 AM Caller name: Augustin Coupe  Relation to pt: daughter  Call back number: [phone]  Reason for call:  Dr. Thereasa Parkin is requesting pt to have MRI. Requesting orders please advise were pt should go to have MRI.

## 2015-02-28 NOTE — Telephone Encounter (Signed)
MRI of what?   I won't be back until Thursday so if cody can put it in , Id appreciate it

## 2015-03-01 NOTE — Telephone Encounter (Signed)
MRI order placed. Patient will be contacted to schedule.

## 2015-03-10 ENCOUNTER — Other Ambulatory Visit (HOSPITAL_BASED_OUTPATIENT_CLINIC_OR_DEPARTMENT_OTHER): Payer: Medicare Other

## 2015-03-11 ENCOUNTER — Ambulatory Visit (HOSPITAL_BASED_OUTPATIENT_CLINIC_OR_DEPARTMENT_OTHER)
Admission: RE | Admit: 2015-03-11 | Discharge: 2015-03-11 | Disposition: A | Discharge status: Home / Self Care | Payer: Medicare Other | Source: Ambulatory Visit | Attending: Physician Assistant | Admitting: Physician Assistant

## 2015-03-11 DIAGNOSIS — M9973 Connective tissue and disc stenosis of intervertebral foramina of lumbar region: Secondary | ICD-10-CM | POA: Diagnosis not present

## 2015-03-11 DIAGNOSIS — M5416 Radiculopathy, lumbar region: Secondary | ICD-10-CM | POA: Insufficient documentation

## 2015-03-11 DIAGNOSIS — M5116 Intervertebral disc disorders with radiculopathy, lumbar region: Secondary | ICD-10-CM | POA: Diagnosis not present

## 2015-03-11 DIAGNOSIS — M4806 Spinal stenosis, lumbar region: Secondary | ICD-10-CM | POA: Diagnosis not present

## 2015-03-11 DIAGNOSIS — M47816 Spondylosis without myelopathy or radiculopathy, lumbar region: Secondary | ICD-10-CM | POA: Diagnosis not present

## 2015-03-14 NOTE — Telephone Encounter (Signed)
Faxed recent MRI results to Dr. Thereasa Parkin on (03-13-15).  Confirmation received.//AB/CMA

## 2015-03-16 ENCOUNTER — Other Ambulatory Visit: Payer: Medicare Other

## 2015-03-28 DIAGNOSIS — M47817 Spondylosis without myelopathy or radiculopathy, lumbosacral region: Secondary | ICD-10-CM | POA: Diagnosis not present

## 2015-03-28 DIAGNOSIS — G8929 Other chronic pain: Secondary | ICD-10-CM | POA: Diagnosis not present

## 2015-03-28 DIAGNOSIS — M545 Low back pain: Secondary | ICD-10-CM | POA: Diagnosis not present

## 2015-04-05 DIAGNOSIS — M47816 Spondylosis without myelopathy or radiculopathy, lumbar region: Secondary | ICD-10-CM | POA: Diagnosis not present

## 2015-04-07 ENCOUNTER — Ambulatory Visit (HOSPITAL_BASED_OUTPATIENT_CLINIC_OR_DEPARTMENT_OTHER)
Admission: RE | Admit: 2015-04-07 | Discharge: 2015-04-07 | Disposition: A | Discharge status: Home / Self Care | Payer: Medicare Other | Source: Ambulatory Visit | Attending: Family | Admitting: Family

## 2015-04-07 ENCOUNTER — Encounter: Payer: Self-pay | Admitting: Family

## 2015-04-07 DIAGNOSIS — M810 Age-related osteoporosis without current pathological fracture: Secondary | ICD-10-CM

## 2015-04-19 DIAGNOSIS — M545 Low back pain: Secondary | ICD-10-CM | POA: Diagnosis not present

## 2015-04-19 DIAGNOSIS — R2681 Unsteadiness on feet: Secondary | ICD-10-CM | POA: Diagnosis not present

## 2015-04-19 DIAGNOSIS — M419 Scoliosis, unspecified: Secondary | ICD-10-CM | POA: Diagnosis not present

## 2015-04-21 ENCOUNTER — Encounter: Payer: Self-pay | Admitting: Family

## 2015-05-02 ENCOUNTER — Ambulatory Visit: Payer: Medicare Other | Admitting: Family

## 2015-05-19 DIAGNOSIS — L820 Inflamed seborrheic keratosis: Secondary | ICD-10-CM | POA: Diagnosis not present

## 2015-05-19 DIAGNOSIS — L570 Actinic keratosis: Secondary | ICD-10-CM | POA: Diagnosis not present

## 2015-05-24 ENCOUNTER — Ambulatory Visit: Payer: Medicare Other | Admitting: Family

## 2015-05-26 ENCOUNTER — Encounter: Payer: Self-pay | Admitting: Family

## 2015-05-26 ENCOUNTER — Ambulatory Visit (INDEPENDENT_AMBULATORY_CARE_PROVIDER_SITE_OTHER): Payer: Medicare Other | Admitting: Family

## 2015-05-26 VITALS — BP 108/80 | HR 85 | Temp 98.0°F | Resp 16 | Ht 62.0 in | Wt 123.8 lb

## 2015-05-26 DIAGNOSIS — Z23 Encounter for immunization: Secondary | ICD-10-CM

## 2015-05-26 DIAGNOSIS — M810 Age-related osteoporosis without current pathological fracture: Secondary | ICD-10-CM

## 2015-05-26 DIAGNOSIS — C9110 Chronic lymphocytic leukemia of B-cell type not having achieved remission: Secondary | ICD-10-CM | POA: Diagnosis not present

## 2015-05-26 DIAGNOSIS — E559 Vitamin D deficiency, unspecified: Secondary | ICD-10-CM | POA: Diagnosis not present

## 2015-05-26 LAB — VITAMIN D 25 HYDROXY (VIT D DEFICIENCY, FRACTURES): VITD: 24.87 ng/mL — ABNORMAL LOW (ref 30.00–100.00)

## 2015-05-26 NOTE — Progress Notes (Signed)
Pre visit review using our clinic review tool, if applicable. No additional management support is needed unless otherwise documented below in the visit note. 

## 2015-05-26 NOTE — Progress Notes (Signed)
Subjective:    Patient ID: Alison Hammond, female    DOB: 01/07/1925, 81 y.o.   MRN: [id number]  HPI  Alison Hammond is an 81 yr old female who presents today for follow up.  1) Vit D- Pt was given 12 week supply of weekly vit D 50000 on 3/25.  She completed the vitamin D. She is no longer taking supplement.   2) Osteoporosis- pt had bone density performed back in July of this year.  Bone density + for osteoporosis. She has taken boniva remotely.    3) CLL- Sees Dr. Alvy Hammond   Review of Systems    see HPI  Past Medical History  Diagnosis Date  . Osteoporosis   . Chronic low back pain   . DDD (degenerative disc disease), lumbar   . Chronic lymphocytic leukemia   . Hx of adenomatous colonic polyps     no polyps in 1/08  . Hyperlipidemia   . Glaucoma   . Heart murmur   . History of blood transfusion     due to fracture of femur  . Insomnia 06/27/2014    Social History   Social History  . Marital Status: Single    Spouse Name: N/A  . Number of Children: 1  . Years of Education: N/A   Occupational History  .     Social History Main Topics  . Smoking status: Never Smoker   . Smokeless tobacco: Never Used  . Alcohol Use: 4.2 oz/week    7 Glasses of wine per week     Comment: 1 glass wine daily  . Drug Use: No  . Sexual Activity: Not on file   Other Topics Concern  . 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 Breast enhancement surgery  1978, 4196    with silicone  . Knee arthroscopy  1996    right  . Repair of crushed femur  1997  . Cataract extraction      right eye. Also has bilateral eye implants for distance and close up vision.  . Abdominal hysterectomy  2013    at [facility] History    Problem Relation Age of Onset  . Colon cancer Mother     Died from colon caner   . Esophageal cancer Father   . Arthritis Father   . Heart disease Father   . Fibromyalgia Daughter   . Stroke Maternal Grandmother     No Known Allergies  Current Outpatient Prescriptions on File Prior to Visit  Medication Sig Dispense Refill  . aspirin 81 MG tablet Take 81 mg by mouth daily.      . Calcium-Magnesium-Vitamin D (CALCIUM MAGNESIUM PO) Take 1 tablet by mouth daily.     . Coenzyme Q10 (COQ10 PO) Take 2 tablets by mouth daily.    Marland Kitchen latanoprost (XALATAN) 0.005 % ophthalmic solution Place 1 drop into the right eye daily. 2.5 mL 2  . naproxen sodium (ANAPROX) 220 MG tablet Take 220 mg by mouth daily.    . Omega-3 Fatty Acids (FISH OIL) 1000 MG CAPS Take 1 capsule by mouth daily.     Marland Kitchen tolterodine (  DETROL) 2 MG tablet Take 1 tablet (2 mg total) by mouth 2 (two) times daily. 60 tablet 2   No current facility-administered medications on file prior to visit.    BP 108/80 mmHg  Pulse 85  Temp(Src) 98 F (36.7 C) (Oral)  Resp 16  Ht 5\' 2"  (1.575 m)  Wt 123 lb 12.8 oz (56.155 kg)  BMI 22.64 kg/m2  SpO2 100%    Objective:   Physical Exam  Constitutional: She is oriented to person, place, and time. She appears well-developed and well-nourished.  HENT:  Head: Normocephalic and atraumatic.  Eyes: No scleral icterus.  Cardiovascular: Normal rate, regular rhythm and normal heart sounds.   No murmur heard. Pulmonary/Chest: Effort normal and breath sounds normal. No respiratory distress. She has no wheezes.  Musculoskeletal: She exhibits no edema.  Neurological: She is alert and oriented to person, place, and time.  Psychiatric: She has a normal mood and affect. Her behavior is normal. Judgment and thought content normal.          Assessment & Plan:

## 2015-05-26 NOTE — Patient Instructions (Addendum)
Please complete lab work prior to leaving. Keep upcoming appointment with Dr. Alvy Bimler. Follow up in 6 months, sooner if problems/concerns.

## 2015-05-27 ENCOUNTER — Telehealth: Payer: Self-pay | Admitting: Family

## 2015-05-27 DIAGNOSIS — E559 Vitamin D deficiency, unspecified: Secondary | ICD-10-CM

## 2015-05-27 MED ORDER — VITAMIN D (ERGOCALCIFEROL) 1.25 MG (50000 UNIT) PO CAPS: 50000.0000 [IU] | ORAL_CAPSULE | ORAL | Status: DC

## 2015-05-27 NOTE — Telephone Encounter (Signed)
Vitamin D level is low.  Advise patient to begin vit D 50000 units once weekly for 12 weeks, then repeat vit D level (dx Vit D deficiency).     

## 2015-05-29 NOTE — Telephone Encounter (Signed)
Spoke with pt and she voices understanding. Pt has future lab appt on 08/29/15.

## 2015-05-30 ENCOUNTER — Other Ambulatory Visit: Payer: Self-pay | Admitting: Family

## 2015-05-30 NOTE — Assessment & Plan Note (Signed)
Reviewed bone density performed in the end of July.  Frax calculations: 19%(major osteoporotic fracture) 7.8 % (hip fracture risk).  She has been on boniva in the past. Would recommend that she resume. See phone note 9/13.

## 2015-05-30 NOTE — Telephone Encounter (Signed)
Please let pt know that I reviewed her bone density test. It shows osteoporosis and high risk of fracture.  I would recommend that she resume boniva (pended below)

## 2015-05-30 NOTE — Assessment & Plan Note (Signed)
Lab Results  Component Value Date   WBC 11.8* 12/06/2014   HGB 13.3 12/06/2014   HCT 40.0 12/06/2014   MCV 88.0 12/06/2014   PLT 287.0 12/06/2014   Clinically stable. Management per Dr. Alvy Bimler.

## 2015-05-30 NOTE — Assessment & Plan Note (Signed)
Vit D level is low. Restart weekly 50000 unit supplements.

## 2015-05-31 ENCOUNTER — Telehealth: Payer: Self-pay | Admitting: Family

## 2015-05-31 MED ORDER — IBANDRONATE SODIUM 150 MG PO TABS: 150.0000 mg | ORAL_TABLET | ORAL | Status: DC

## 2015-05-31 NOTE — Telephone Encounter (Signed)
Left for pt to return my call.

## 2015-05-31 NOTE — Telephone Encounter (Addendum)
error:[id number] ° °

## 2015-05-31 NOTE — Telephone Encounter (Signed)
Notified pt and she voices understanding. Rx printed and was called to pharmacy voicemail.

## 2015-05-31 NOTE — Telephone Encounter (Signed)
Please disregard message below

## 2015-06-28 ENCOUNTER — Other Ambulatory Visit (HOSPITAL_BASED_OUTPATIENT_CLINIC_OR_DEPARTMENT_OTHER): Payer: Medicare Other

## 2015-06-28 ENCOUNTER — Telehealth: Payer: Self-pay | Admitting: Hematology and Oncology

## 2015-06-28 ENCOUNTER — Encounter: Payer: Self-pay | Admitting: Hematology and Oncology

## 2015-06-28 ENCOUNTER — Ambulatory Visit (HOSPITAL_BASED_OUTPATIENT_CLINIC_OR_DEPARTMENT_OTHER): Payer: Medicare Other | Admitting: Hematology and Oncology

## 2015-06-28 VITALS — BP 156/63 | HR 99 | Temp 97.7°F | Resp 17 | Ht 62.0 in | Wt 124.3 lb

## 2015-06-28 DIAGNOSIS — Z8579 Personal history of other malignant neoplasms of lymphoid, hematopoietic and related tissues: Secondary | ICD-10-CM

## 2015-06-28 DIAGNOSIS — I10 Essential (primary) hypertension: Secondary | ICD-10-CM | POA: Insufficient documentation

## 2015-06-28 DIAGNOSIS — C9110 Chronic lymphocytic leukemia of B-cell type not having achieved remission: Secondary | ICD-10-CM

## 2015-06-28 LAB — COMPREHENSIVE METABOLIC PANEL (CC13)
ALBUMIN: 3.9 g/dL (ref 3.5–5.0)
ALT: 19 U/L (ref 0–55)
ANION GAP: 8 meq/L (ref 3–11)
AST: 21 U/L (ref 5–34)
Alkaline Phosphatase: 55 U/L (ref 40–150)
BUN: 18.8 mg/dL (ref 7.0–26.0)
CO2: 27 mEq/L (ref 22–29)
Calcium: 9.2 mg/dL (ref 8.4–10.4)
Chloride: 104 mEq/L (ref 98–109)
Creatinine: 0.9 mg/dL (ref 0.6–1.1)
EGFR: 54 mL/min/{1.73_m2} — AB (ref >90)
Glucose: 148 mg/dl — ABNORMAL HIGH (ref 70–140)
POTASSIUM: 4.3 meq/L (ref 3.5–5.1)
SODIUM: 140 meq/L (ref 136–145)
Total Bilirubin: 0.6 mg/dL (ref 0.20–1.20)
Total Protein: 6.5 g/dL (ref 6.4–8.3)

## 2015-06-28 LAB — CBC WITH DIFFERENTIAL/PLATELET
BASO%: 0.3 % (ref 0.0–2.0)
BASOS ABS: 0 10*3/uL (ref 0.0–0.1)
EOS ABS: 0.1 10*3/uL (ref 0.0–0.5)
EOS%: 0.5 % (ref 0.0–7.0)
HCT: 38.5 % (ref 34.8–46.6)
HEMOGLOBIN: 12.9 g/dL (ref 11.6–15.9)
LYMPH%: 65.2 % — ABNORMAL HIGH (ref 14.0–49.7)
MCH: 31.2 pg (ref 25.1–34.0)
MCHC: 33.5 g/dL (ref 31.5–36.0)
MCV: 93 fL (ref 79.5–101.0)
MONO#: 0.8 10*3/uL (ref 0.1–0.9)
MONO%: 6.4 % (ref 0.0–14.0)
NEUT%: 27.6 % — ABNORMAL LOW (ref 38.4–76.8)
NEUTROS ABS: 3.6 10*3/uL (ref 1.5–6.5)
PLATELETS: 274 10*3/uL (ref 145–400)
RBC: 4.14 10*6/uL (ref 3.70–5.45)
RDW: 15.5 % — ABNORMAL HIGH (ref 11.2–14.5)
WBC: 13.1 10*3/uL — AB (ref 3.9–10.3)
lymph#: 8.5 10*3/uL — ABNORMAL HIGH (ref 0.9–3.3)

## 2015-06-28 LAB — LACTATE DEHYDROGENASE (CC13): LDH: 164 U/L (ref 125–245)

## 2015-06-28 LAB — TECHNOLOGIST REVIEW

## 2015-06-28 NOTE — Telephone Encounter (Signed)
lvm for pt regarding to OCT 2017 appt..... °

## 2015-06-28 NOTE — Assessment & Plan Note (Signed)
Clinically, she has no signs of disease progression. I will see her on a yearly basis with history, physical examination and blood work.

## 2015-06-28 NOTE — Progress Notes (Signed)
[facility] OFFICE PROGRESS NOTE  Patient Care Team: Debbrah Alar, NP as PCP - General (Internal Medicine) Heath Lark, MD as Consulting Physician (Hematology and Oncology) Kary Kos, MD as Consulting Physician (Neurosurgery) Johnnette Gourd, MD (Physical Medicine and Rehabilitation)  SUMMARY OF ONCOLOGIC HISTORY:  She was diagnosed with CLL since 2005. The patient was placed on observation. Over the past few years, she denies any history of recurrent infection, abnormal weight loss, recent fevers, chills, night sweats. Her appetite is stable. The patient numerous skin lesions removed over the years but never been diagnosed with skin cancer or melanoma. A year ago, she was discovered to have a small ovarian cyst was removed at [facility] without complication.  INTERVAL HISTORY: Please see below for problem oriented charting. She feels well. No new lymphadenopathy. Denies recent infection.  REVIEW OF SYSTEMS:   Constitutional: Denies fevers, chills or abnormal weight loss Eyes: Denies blurriness of vision Ears, nose, mouth, throat, and face: Denies mucositis or sore throat Respiratory: Denies cough, dyspnea or wheezes Cardiovascular: Denies palpitation, chest discomfort or lower extremity swelling Gastrointestinal:  Denies nausea, heartburn or change in bowel habits Skin: Denies abnormal skin rashes Lymphatics: Denies new lymphadenopathy or easy bruising Neurological:Denies numbness, tingling or new weaknesses Behavioral/Psych: Mood is stable, no new changes  All other systems were reviewed with the patient and are negative.  I have reviewed the past medical history, past surgical history, social history and family history with the patient and they are unchanged from previous note.  ALLERGIES:  has No Known Allergies.  MEDICATIONS:  Current Outpatient Prescriptions  Medication Sig Dispense Refill  . acetaminophen (TYLENOL) 500 MG tablet Take 1,000  mg by mouth every morning.    Marland Kitchen aspirin 81 MG tablet Take 81 mg by mouth daily.      . Calcium-Magnesium-Vitamin D (CALCIUM MAGNESIUM PO) Take 1 tablet by mouth daily.     . Coenzyme Q10 (COQ10 PO) Take 2 tablets by mouth daily.    . Diphenhydramine-APAP, sleep, (TYLENOL PM EXTRA STRENGTH PO) Take 2 tablets by mouth at bedtime.    Marland Kitchen latanoprost (XALATAN) 0.005 % ophthalmic solution Place 1 drop into the right eye daily. 2.5 mL 2  . naproxen sodium (ANAPROX) 220 MG tablet Take 220 mg by mouth daily.    . Omega-3 Fatty Acids (FISH OIL) 1000 MG CAPS Take 1 capsule by mouth daily.     Marland Kitchen senna-docusate (SENOKOT-S) 8.6-50 MG per tablet Take 1 tablet by mouth daily as needed for mild constipation.    Marland Kitchen tolterodine (DETROL) 2 MG tablet Take 1 tablet (2 mg total) by mouth 2 (two) times daily. 60 tablet 2  . Vitamin D, Ergocalciferol, (DRISDOL) 50000 UNITS CAPS capsule Take 1 capsule (50,000 Units total) by mouth every 7 (seven) days. 12 capsule 0   No current facility-administered medications for this visit.    PHYSICAL EXAMINATION: ECOG PERFORMANCE STATUS: 0 - Asymptomatic  Filed Vitals:   06/28/15 1045  BP: 156/63  Pulse: 99  Temp: 97.7 F (36.5 C)  Resp: 17   Filed Weights   06/28/15 1045  Weight: 124 lb 4.8 oz (56.382 kg)    GENERAL:alert, no distress and comfortable SKIN: skin color, texture, turgor are normal, no rashes or significant lesions EYES: normal, Conjunctiva are pink and non-injected, sclera clear OROPHARYNX:no exudate, no erythema and lips, buccal mucosa, and tongue normal  NECK: supple, thyroid normal size, non-tender, without nodularity LYMPH:  no palpable lymphadenopathy in the cervical, axillary or inguinal  LUNGS: clear to auscultation and percussion with normal breathing effort HEART: regular rate & rhythm and no murmurs and no lower extremity edema ABDOMEN:abdomen soft, non-tender and normal bowel sounds Musculoskeletal:no cyanosis of digits and no clubbing   NEURO: alert & oriented x 3 with fluent speech, no focal motor/sensory deficits  LABORATORY DATA:  I have reviewed the data as listed    Component Value Date/Time   NA 140 06/28/2015 1029   NA 137 12/06/2014 0817   K 4.3 06/28/2015 1029   K 4.4 12/06/2014 0817   CL 102 12/06/2014 0817   CL 101 11/06/2012 0806   CO2 27 06/28/2015 1029   CO2 27 12/06/2014 0817   GLUCOSE 148* 06/28/2015 1029   GLUCOSE 102* 12/06/2014 0817   GLUCOSE 151* 11/06/2012 0806   BUN 18.8 06/28/2015 1029   BUN 20 12/06/2014 0817   CREATININE 0.9 06/28/2015 1029   CREATININE 0.80 12/06/2014 0817   CREATININE 0.81 08/18/2012 0925   CALCIUM 9.2 06/28/2015 1029   CALCIUM 9.2 12/06/2014 0817   PROT 6.5 06/28/2015 1029   PROT 6.0 11/07/2011 1107   ALBUMIN 3.9 06/28/2015 1029   ALBUMIN 3.9 11/07/2011 1107   AST 21 06/28/2015 1029   AST 20 11/07/2011 1107   ALT 19 06/28/2015 1029   ALT 17 11/07/2011 1107   ALKPHOS 55 06/28/2015 1029   ALKPHOS 46 11/07/2011 1107   BILITOT 0.60 06/28/2015 1029   BILITOT 0.6 11/07/2011 1107   GFRNONAA >60 06/24/2009 1140   GFRAA  06/24/2009 1140    >60        The eGFR has been calculated using the MDRD equation. This calculation has not been validated in all clinical situations. eGFR's persistently <60 mL/min signify possible Chronic Kidney Disease.    No results found for: SPEP, UPEP  Lab Results  Component Value Date   WBC 13.1* 06/28/2015   NEUTROABS 3.6 06/28/2015   HGB 12.9 06/28/2015   HCT 38.5 06/28/2015   MCV 93.0 06/28/2015   PLT 274 06/28/2015      Chemistry      Component Value Date/Time   NA 140 06/28/2015 1029   NA 137 12/06/2014 0817   K 4.3 06/28/2015 1029   K 4.4 12/06/2014 0817   CL 102 12/06/2014 0817   CL 101 11/06/2012 0806   CO2 27 06/28/2015 1029   CO2 27 12/06/2014 0817   BUN 18.8 06/28/2015 1029   BUN 20 12/06/2014 0817   CREATININE 0.9 06/28/2015 1029   CREATININE 0.80 12/06/2014 0817   CREATININE 0.81 08/18/2012 0925       Component Value Date/Time   CALCIUM 9.2 06/28/2015 1029   CALCIUM 9.2 12/06/2014 0817   ALKPHOS 55 06/28/2015 1029   ALKPHOS 46 11/07/2011 1107   AST 21 06/28/2015 1029   AST 20 11/07/2011 1107   ALT 19 06/28/2015 1029   ALT 17 11/07/2011 1107   BILITOT 0.60 06/28/2015 1029   BILITOT 0.6 11/07/2011 1107       ASSESSMENT & PLAN:  Chronic lymphocytic leukemia Clinically, she has no signs of disease progression. I will see her on a yearly basis with history, physical examination and blood work.     Essential hypertension she will continue current medical management. I recommend close follow-up with primary care doctor for medication adjustment.    Orders Placed This Encounter  Procedures  . CBC with Differential/Platelet    Standing Status: Future     Number of Occurrences:      Standing Expiration  Date: 08/01/2016   All questions were answered. The patient knows to call the clinic with any problems, questions or concerns. No barriers to learning was detected. I spent 15 minutes counseling the patient face to face. The total time spent in the appointment was 20 minutes and more than 50% was on counseling and review of test results     [facility], [place], MD 06/28/2015 1:22 PM

## 2015-06-28 NOTE — Assessment & Plan Note (Signed)
she will continue current medical management. I recommend close follow-up with primary care doctor for medication adjustment.  

## 2015-08-07 DIAGNOSIS — C44622 Squamous cell carcinoma of skin of right upper limb, including shoulder: Secondary | ICD-10-CM | POA: Diagnosis not present

## 2015-08-07 DIAGNOSIS — D492 Neoplasm of unspecified behavior of bone, soft tissue, and skin: Secondary | ICD-10-CM | POA: Diagnosis not present

## 2015-08-22 ENCOUNTER — Telehealth: Payer: Self-pay | Admitting: *Deleted

## 2015-08-22 ENCOUNTER — Ambulatory Visit (HOSPITAL_BASED_OUTPATIENT_CLINIC_OR_DEPARTMENT_OTHER): Payer: Medicare Other | Admitting: Hematology and Oncology

## 2015-08-22 ENCOUNTER — Encounter: Payer: Self-pay | Admitting: Hematology and Oncology

## 2015-08-22 VITALS — BP 166/64 | HR 88 | Temp 98.4°F | Resp 18 | Wt 126.0 lb

## 2015-08-22 DIAGNOSIS — C44602 Unspecified malignant neoplasm of skin of right upper limb, including shoulder: Secondary | ICD-10-CM | POA: Diagnosis not present

## 2015-08-22 DIAGNOSIS — C44601 Unspecified malignant neoplasm of skin of unspecified upper limb, including shoulder: Secondary | ICD-10-CM | POA: Insufficient documentation

## 2015-08-22 DIAGNOSIS — Z8579 Personal history of other malignant neoplasms of lymphoid, hematopoietic and related tissues: Secondary | ICD-10-CM | POA: Diagnosis not present

## 2015-08-22 NOTE — Telephone Encounter (Signed)
LVM for Medical Records at [facility] requesting copy of recent Pathology report pt's right hand.   I also faxed signed release for medical records to their office requesting Path report.

## 2015-08-22 NOTE — Assessment & Plan Note (Signed)
The patient has been told that she has newly diagnosed skin cancer based on recent skin biopsy. I have asked her to sign consent to release information so that we can obtain the biopsy report. According to her, her dermatologist have recommended excision of the skin cancer. The patient does not want to have surgery here in [place] and asked my opinion. Due to the location on her right hand, I suspect plastic surgery might need to be involved. She had good success with abdominal surgery at [facility] in the past and she is willing to be referred back to [facility] for excision. I will refer her to be seen there for further management.

## 2015-08-22 NOTE — Progress Notes (Signed)
[facility] OFFICE PROGRESS NOTE  Patient Care Team: Debbrah Alar, NP as PCP - General (Internal Medicine) Heath Lark, MD as Consulting Physician (Hematology and Oncology) Kary Kos, MD as Consulting Physician (Neurosurgery) Johnnette Gourd, MD (Physical Medicine and Rehabilitation)  SUMMARY OF ONCOLOGIC HISTORY:  She was diagnosed with CLL since 2005. The patient was placed on observation. Over the past few years, she denies any history of recurrent infection, abnormal weight loss, recent fevers, chills, night sweats. Her appetite is stable. The patient numerous skin lesions removed over the years but never been diagnosed with skin cancer or melanoma. A year ago, she was discovered to have a small ovarian cyst was removed at [facility] without complication.  INTERVAL HISTORY: Please see below for problem oriented charting. The patient waited to be seen as an urgent add-on today to address concern regarding recent diagnosis of skin cancer. She noted a new skin lesion over the dorsum of her right hand recently. She went to the dermatologist office and had biopsy and was told she had skin cancer. I do not have outside records. Since the biopsy, she denies new symptoms or bleeding  REVIEW OF SYSTEMS:   Constitutional: Denies fevers, chills or abnormal weight loss Eyes: Denies blurriness of vision Ears, nose, mouth, throat, and face: Denies mucositis or sore throat Respiratory: Denies cough, dyspnea or wheezes Cardiovascular: Denies palpitation, chest discomfort or lower extremity swelling Gastrointestinal:  Denies nausea, heartburn or change in bowel habits Lymphatics: Denies new lymphadenopathy or easy bruising Neurological:Denies numbness, tingling or new weaknesses Behavioral/Psych: Mood is stable, no new changes  All other systems were reviewed with the patient and are negative.  I have reviewed the past medical history, past surgical history, social  history and family history with the patient and they are unchanged from previous note.  ALLERGIES:  has No Known Allergies.  MEDICATIONS:  Current Outpatient Prescriptions  Medication Sig Dispense Refill  . acetaminophen (TYLENOL) 500 MG tablet Take 1,000 mg by mouth every morning.    Marland Kitchen aspirin 81 MG tablet Take 81 mg by mouth daily.      . Calcium-Magnesium-Vitamin D (CALCIUM MAGNESIUM PO) Take 1 tablet by mouth daily.     . Coenzyme Q10 (COQ10 PO) Take 2 tablets by mouth daily.    . Diphenhydramine-APAP, sleep, (TYLENOL PM EXTRA STRENGTH PO) Take 2 tablets by mouth at bedtime.    Marland Kitchen latanoprost (XALATAN) 0.005 % ophthalmic solution Place 1 drop into the right eye daily. 2.5 mL 2  . naproxen sodium (ANAPROX) 220 MG tablet Take 220 mg by mouth daily.    . Omega-3 Fatty Acids (FISH OIL) 1000 MG CAPS Take 1 capsule by mouth daily.     Marland Kitchen senna-docusate (SENOKOT-S) 8.6-50 MG per tablet Take 1 tablet by mouth daily as needed for mild constipation.    Marland Kitchen tolterodine (DETROL) 2 MG tablet Take 1 tablet (2 mg total) by mouth 2 (two) times daily. 60 tablet 2  . Vitamin D, Ergocalciferol, (DRISDOL) 50000 UNITS CAPS capsule Take 1 capsule (50,000 Units total) by mouth every 7 (seven) days. 12 capsule 0   No current facility-administered medications for this visit.    PHYSICAL EXAMINATION: ECOG PERFORMANCE STATUS: 0 - Asymptomatic  Filed Vitals:   08/22/15 1314  BP: 166/64  Pulse: 88  Temp: 98.4 F (36.9 C)  Resp: 18   Filed Weights   08/22/15 1314  Weight: 126 lb (57.153 kg)    GENERAL:alert, no distress and comfortable SKIN: Noted to  keratoma-like lesion over the dorsum of the right hand. EYES: normal, Conjunctiva are pink and non-injected, sclera clear Musculoskeletal:no cyanosis of digits and no clubbing  NEURO: alert & oriented x 3 with fluent speech, no focal motor/sensory deficits  LABORATORY DATA:  I have reviewed the data as listed    Component Value Date/Time   NA 140  06/28/2015 1029   NA 137 12/06/2014 0817   K 4.3 06/28/2015 1029   K 4.4 12/06/2014 0817   CL 102 12/06/2014 0817   CL 101 11/06/2012 0806   CO2 27 06/28/2015 1029   CO2 27 12/06/2014 0817   GLUCOSE 148* 06/28/2015 1029   GLUCOSE 102* 12/06/2014 0817   GLUCOSE 151* 11/06/2012 0806   BUN 18.8 06/28/2015 1029   BUN 20 12/06/2014 0817   CREATININE 0.9 06/28/2015 1029   CREATININE 0.80 12/06/2014 0817   CREATININE 0.81 08/18/2012 0925   CALCIUM 9.2 06/28/2015 1029   CALCIUM 9.2 12/06/2014 0817   PROT 6.5 06/28/2015 1029   PROT 6.0 11/07/2011 1107   ALBUMIN 3.9 06/28/2015 1029   ALBUMIN 3.9 11/07/2011 1107   AST 21 06/28/2015 1029   AST 20 11/07/2011 1107   ALT 19 06/28/2015 1029   ALT 17 11/07/2011 1107   ALKPHOS 55 06/28/2015 1029   ALKPHOS 46 11/07/2011 1107   BILITOT 0.60 06/28/2015 1029   BILITOT 0.6 11/07/2011 1107   GFRNONAA >60 06/24/2009 1140   GFRAA  06/24/2009 1140    >60        The eGFR has been calculated using the MDRD equation. This calculation has not been validated in all clinical situations. eGFR's persistently <60 mL/min signify possible Chronic Kidney Disease.    No results found for: SPEP, UPEP  Lab Results  Component Value Date   WBC 13.1* 06/28/2015   NEUTROABS 3.6 06/28/2015   HGB 12.9 06/28/2015   HCT 38.5 06/28/2015   MCV 93.0 06/28/2015   PLT 274 06/28/2015      Chemistry      Component Value Date/Time   NA 140 06/28/2015 1029   NA 137 12/06/2014 0817   K 4.3 06/28/2015 1029   K 4.4 12/06/2014 0817   CL 102 12/06/2014 0817   CL 101 11/06/2012 0806   CO2 27 06/28/2015 1029   CO2 27 12/06/2014 0817   BUN 18.8 06/28/2015 1029   BUN 20 12/06/2014 0817   CREATININE 0.9 06/28/2015 1029   CREATININE 0.80 12/06/2014 0817   CREATININE 0.81 08/18/2012 0925      Component Value Date/Time   CALCIUM 9.2 06/28/2015 1029   CALCIUM 9.2 12/06/2014 0817   ALKPHOS 55 06/28/2015 1029   ALKPHOS 46 11/07/2011 1107   AST 21 06/28/2015 1029    AST 20 11/07/2011 1107   ALT 19 06/28/2015 1029   ALT 17 11/07/2011 1107   BILITOT 0.60 06/28/2015 1029   BILITOT 0.6 11/07/2011 1107     ASSESSMENT & PLAN:  Skin cancer, upper extremity The patient has been told that she has newly diagnosed skin cancer based on recent skin biopsy. I have asked her to sign consent to release information so that we can obtain the biopsy report. According to her, her dermatologist have recommended excision of the skin cancer. The patient does not want to have surgery here in [place] and asked my opinion. Due to the location on her right hand, I suspect plastic surgery might need to be involved. She had good success with abdominal surgery at [facility] in the past and she  is willing to be referred back to [facility] for excision. I will refer her to be seen there for further management.   No orders of the defined types were placed in this encounter.   All questions were answered. The patient knows to call the clinic with any problems, questions or concerns. No barriers to learning was detected. I spent 15 minutes counseling the patient face to face. The total time spent in the appointment was 20 minutes and more than 50% was on counseling and review of test results     [place], [place], MD 08/22/2015 2:13 PM

## 2015-08-24 ENCOUNTER — Telehealth: Payer: Self-pay | Admitting: *Deleted

## 2015-08-24 NOTE — Telephone Encounter (Signed)
Called Referral to [facility] referral line for pt to see Plastic Surgeon to remove skin cancer from back of pt's hand. .   Was givenanother number to call referral to their Plastic/ Reconstruction and Wound Care Specialist, Dr. Parke Poisson at [phone].   I LVM on the Triage Nurse line requesting call back to make this referral.

## 2015-08-25 ENCOUNTER — Telehealth: Payer: Self-pay | Admitting: *Deleted

## 2015-08-25 NOTE — Telephone Encounter (Signed)
Referral made to [facility],  Dr. Sharlyne Pacas, for removal of skin cancer from pt's hand.   Appt made for Thurs 12/15 at 10:45 am.   Faxed office note and pathology report to Ellen/ Dr. Sharlyne Pacas at Fax [phone].  Their office number is [phone].   Notified pt of Appt and all information above.  Office is at the [facility],  [facility] in the [place].    Pt wrote down all information and read back to this nurse several times.

## 2015-08-29 ENCOUNTER — Other Ambulatory Visit: Payer: Medicare Other

## 2015-08-31 DIAGNOSIS — C801 Malignant (primary) neoplasm, unspecified: Secondary | ICD-10-CM | POA: Diagnosis not present

## 2015-08-31 DIAGNOSIS — C44622 Squamous cell carcinoma of skin of right upper limb, including shoulder: Secondary | ICD-10-CM | POA: Diagnosis not present

## 2015-09-07 DIAGNOSIS — L570 Actinic keratosis: Secondary | ICD-10-CM | POA: Diagnosis not present

## 2015-09-07 DIAGNOSIS — C801 Malignant (primary) neoplasm, unspecified: Secondary | ICD-10-CM | POA: Diagnosis not present

## 2015-09-07 DIAGNOSIS — C44622 Squamous cell carcinoma of skin of right upper limb, including shoulder: Secondary | ICD-10-CM | POA: Diagnosis not present

## 2015-09-07 DIAGNOSIS — C7641 Malignant neoplasm of right upper limb: Secondary | ICD-10-CM | POA: Diagnosis not present

## 2015-09-07 HISTORY — PX: SKIN CANCER EXCISION: SHX779

## 2015-09-19 ENCOUNTER — Other Ambulatory Visit: Payer: Self-pay | Admitting: Family

## 2015-09-21 DIAGNOSIS — Z08 Encounter for follow-up examination after completed treatment for malignant neoplasm: Secondary | ICD-10-CM | POA: Diagnosis not present

## 2015-09-21 DIAGNOSIS — C801 Malignant (primary) neoplasm, unspecified: Secondary | ICD-10-CM | POA: Diagnosis not present

## 2015-10-02 ENCOUNTER — Telehealth: Payer: Self-pay | Admitting: Family

## 2015-10-02 NOTE — Telephone Encounter (Signed)
Pt daughter is concerned about memory issues and wanting to change to Dr. Penni Homans from [place]. She is wanting to get an appt in the near future if possible with Dr. Charlett Blake and wants them to see the same provider. They are perfectly happy with Lenna Sciara, they are just concerned about increasing issues with age.  Is change of providers ok?

## 2015-10-02 NOTE — Telephone Encounter (Signed)
OK with me.

## 2015-10-12 DIAGNOSIS — C4492 Squamous cell carcinoma of skin, unspecified: Secondary | ICD-10-CM | POA: Diagnosis not present

## 2015-10-12 DIAGNOSIS — C801 Malignant (primary) neoplasm, unspecified: Secondary | ICD-10-CM | POA: Diagnosis not present

## 2015-11-09 ENCOUNTER — Telehealth: Payer: Self-pay | Admitting: Family

## 2015-11-09 NOTE — Telephone Encounter (Signed)
Caller name: Jeani Hawking Relationship to patient: Daughter Can be reached: [phone]   Reason for call: Daughter called and asked that when patient comes to appointment on Monday that the doctor address her memory problems. Daughter states that she will be with the patient.

## 2015-11-13 ENCOUNTER — Encounter: Payer: Self-pay | Admitting: Family Medicine

## 2015-11-13 ENCOUNTER — Ambulatory Visit (INDEPENDENT_AMBULATORY_CARE_PROVIDER_SITE_OTHER): Payer: Medicare Other | Admitting: Family Medicine

## 2015-11-13 VITALS — BP 125/67 | HR 87 | Temp 97.4°F | Resp 14 | Ht 62.0 in | Wt 123.4 lb

## 2015-11-13 DIAGNOSIS — G4700 Insomnia, unspecified: Secondary | ICD-10-CM

## 2015-11-13 DIAGNOSIS — I10 Essential (primary) hypertension: Secondary | ICD-10-CM

## 2015-11-13 DIAGNOSIS — R739 Hyperglycemia, unspecified: Secondary | ICD-10-CM | POA: Diagnosis not present

## 2015-11-13 DIAGNOSIS — R32 Unspecified urinary incontinence: Secondary | ICD-10-CM

## 2015-11-13 DIAGNOSIS — E559 Vitamin D deficiency, unspecified: Secondary | ICD-10-CM

## 2015-11-13 DIAGNOSIS — C44602 Unspecified malignant neoplasm of skin of right upper limb, including shoulder: Secondary | ICD-10-CM

## 2015-11-13 NOTE — Progress Notes (Signed)
Pre visit review using our clinic review tool, if applicable. No additional management support is needed unless otherwise documented below in the visit note. 

## 2015-11-13 NOTE — Patient Instructions (Signed)
Hypertension Hypertension, commonly called high blood pressure, is when the force of blood pumping through your arteries is too strong. Your arteries are the blood vessels that carry blood from your heart throughout your body. A blood pressure reading consists of a higher number over a lower number, such as 110/72. The higher number (systolic) is the pressure inside your arteries when your heart pumps. The lower number (diastolic) is the pressure inside your arteries when your heart relaxes. Ideally you want your blood pressure below 120/80. Hypertension forces your heart to work harder to pump blood. Your arteries may become narrow or stiff. Having untreated or uncontrolled hypertension can cause heart attack, stroke, kidney disease, and other problems. RISK FACTORS Some risk factors for high blood pressure are controllable. Others are not.  Risk factors you cannot control include:   Race. You may be at higher risk if you are African American.  Age. Risk increases with age.  Gender. Men are at higher risk than women before age 45 years. After age 65, women are at higher risk than men. Risk factors you can control include:  Not getting enough exercise or physical activity.  Being overweight.  Getting too much fat, sugar, calories, or salt in your diet.  Drinking too much alcohol. SIGNS AND SYMPTOMS Hypertension does not usually cause signs or symptoms. Extremely high blood pressure (hypertensive crisis) may cause headache, anxiety, shortness of breath, and nosebleed. DIAGNOSIS To check if you have hypertension, your health care provider will measure your blood pressure while you are seated, with your arm held at the level of your heart. It should be measured at least twice using the same arm. Certain conditions can cause a difference in blood pressure between your right and left arms. A blood pressure reading that is higher than normal on one occasion does not mean that you need treatment. If  it is not clear whether you have high blood pressure, you may be asked to return on a different day to have your blood pressure checked again. Or, you may be asked to monitor your blood pressure at home for 1 or more weeks. TREATMENT Treating high blood pressure includes making lifestyle changes and possibly taking medicine. Living a healthy lifestyle can help lower high blood pressure. You may need to change some of your habits. Lifestyle changes may include:  Following the DASH diet. This diet is high in fruits, vegetables, and whole grains. It is low in salt, red meat, and added sugars.  Keep your sodium intake below 2,300 mg per day.  Getting at least 30-45 minutes of aerobic exercise at least 4 times per week.  Losing weight if necessary.  Not smoking.  Limiting alcoholic beverages.  Learning ways to reduce stress. Your health care provider may prescribe medicine if lifestyle changes are not enough to get your blood pressure under control, and if one of the following is true:  You are 18-59 years of age and your systolic blood pressure is above 140.  You are 60 years of age or older, and your systolic blood pressure is above 150.  Your diastolic blood pressure is above 90.  You have diabetes, and your systolic blood pressure is over 140 or your diastolic blood pressure is over 90.  You have kidney disease and your blood pressure is above 140/90.  You have heart disease and your blood pressure is above 140/90. Your personal target blood pressure may vary depending on your medical conditions, your age, and other factors. HOME CARE INSTRUCTIONS    Have your blood pressure rechecked as directed by your health care provider.   Take medicines only as directed by your health care provider. Follow the directions carefully. Blood pressure medicines must be taken as prescribed. The medicine does not work as well when you skip doses. Skipping doses also puts you at risk for  problems.  Do not smoke.   Monitor your blood pressure at home as directed by your health care provider. SEEK MEDICAL CARE IF:   You think you are having a reaction to medicines taken.  You have recurrent headaches or feel dizzy.  You have swelling in your ankles.  You have trouble with your vision. SEEK IMMEDIATE MEDICAL CARE IF:  You develop a severe headache or confusion.  You have unusual weakness, numbness, or feel faint.  You have severe chest or abdominal pain.  You vomit repeatedly.  You have trouble breathing. MAKE SURE YOU:   Understand these instructions.  Will watch your condition.  Will get help right away if you are not doing well or get worse.   This information is not intended to replace advice given to you by your health care provider. Make sure you discuss any questions you have with your health care provider.   Document Released: 09/02/2005 Document Revised: 01/17/2015 Document Reviewed: 06/25/2013 Elsevier Interactive Patient Education 2016 [employer]  

## 2015-11-17 DIAGNOSIS — R350 Frequency of micturition: Secondary | ICD-10-CM | POA: Diagnosis not present

## 2015-11-17 DIAGNOSIS — L570 Actinic keratosis: Secondary | ICD-10-CM | POA: Diagnosis not present

## 2015-11-21 ENCOUNTER — Telehealth: Payer: Self-pay | Admitting: *Deleted

## 2015-11-21 NOTE — Telephone Encounter (Signed)
Medical Records brought in by patient , initialed by provider; sent to scan/SLS 03/07

## 2015-11-22 ENCOUNTER — Encounter: Payer: Self-pay | Admitting: Podiatry

## 2015-11-22 ENCOUNTER — Ambulatory Visit: Payer: Medicare Other | Admitting: Family

## 2015-11-22 ENCOUNTER — Ambulatory Visit (INDEPENDENT_AMBULATORY_CARE_PROVIDER_SITE_OTHER): Payer: Medicare Other | Admitting: Podiatry

## 2015-11-22 DIAGNOSIS — M2170 Unequal limb length (acquired), unspecified site: Secondary | ICD-10-CM | POA: Diagnosis not present

## 2015-11-22 NOTE — Progress Notes (Signed)
   Subjective:    Patient ID: Alison Hammond, female    DOB: Jul 01, 1925, 82 y.o.   MRN: [id number]  HPI   82 year old female presents the office with her daughter for concerns of limb length discrepancy with the left side shorter than the right. She had a femur fracture several years ago which resulted in the limb length difference. She's had multiple shoes with lifts present but she was to be reevaluated his been several years she's been measured. She gets some hip and knee pain which has been ongling. No recent injury or trauma.  Review of Systems  All other systems reviewed and are negative.      Objective:   Physical Exam General: AAO x3, NAD  Dermatological: Skin is warm, dry and supple bilateral. Nails x 10 are well manicured; remaining integument appears unremarkable at this time. There are no open sores, no preulcerative lesions, no rash or signs of infection present.  Vascular: Dorsalis Pedis artery and Posterior Tibial artery pedal pulses are 2/4 bilateral with immedate capillary fill time. Pedal hair growth present. No varicosities and no lower extremity edema present bilateral. There is no pain with calf compression, swelling, warmth, erythema.   Neruologic: Grossly intact via light touch bilateral. Vibratory intact via tuning fork bilateral. Protective threshold with Semmes Wienstein monofilament intact to all pedal sites bilateral. Patellar and Achilles deep tendon reflexes 2+ bilateral. No Babinski or clonus noted bilateral.   Musculoskeletal:  There is approximate 2.5  cm limb length difference with the left side shorter than the right side. There is no areas of tenderness bilateral lower shoes. Range of motion intact. MMT 5/5.   Gait: Unassisted, Nonantalgic.      Assessment & Plan:  82 year old female with  Limb length discrepancy -Treatment options discussed including all alternatives, risks, and complications -She was measured today for limb length discrepancy. New  prescription for A shoe lift as provided for 2.5 cm lift to the left side. Hopefully, this will help decrease in hip and knee pain she has been having. Follow-up with sports medicine or ortho if continues.  Her current shoe measures just slightly over 2 cm lift to the left side. -Follow-up after shoe lift made or sooner if  Any issues are to arise. Call if questions concerns.  Celesta Gentile, DPM

## 2015-11-26 DIAGNOSIS — R739 Hyperglycemia, unspecified: Secondary | ICD-10-CM | POA: Insufficient documentation

## 2015-11-26 NOTE — Progress Notes (Signed)
Subjective:    Patient ID: Alison Hammond, female    DOB: May 07, 1925, 82 y.o.   MRN: [id number]  Chief Complaint  Patient presents with  . Follow-up    Pt here for 6 month follow up    HPI Patient is in today for office follow up and to establish with this provider. Has moved her from [place] recently. No recent illness or acute concerns. Has recently had a SCC removed from Right hand. Wears a life shoe on left secondary to injury in 1997. Denies CP/palp/SOB/HA/congestion/fevers/GI or GU c/o. Taking meds as prescribed  Past Medical History  Diagnosis Date  . Osteoporosis   . Chronic low back pain   . DDD (degenerative disc disease), lumbar   . Chronic lymphocytic leukemia ([place])   . Hx of adenomatous colonic polyps     no polyps in 1/08  . Hyperlipidemia   . Glaucoma   . Heart murmur   . History of blood transfusion     due to fracture of femur  . Insomnia 06/27/2014  . Skin cancer   . Squamous cell carcinoma [facility])     Past Surgical History  Procedure Laterality Date  . Breast enhancement surgery  1978, 2334    with silicone  . Knee arthroscopy  1996    right  . Repair of crushed femur  1997  . Cataract extraction      right eye. Also has bilateral eye implants for distance and close up vision.  . Abdominal hysterectomy  2013    at [facility]  . Skin cancer excision Right 12.22.16    Squamous Cell Carcinoma; [place]    Family History  Problem Relation Age of Onset  . Colon cancer Mother     Died from colon caner   . Esophageal cancer Father   . Arthritis Father   . Heart disease Father   . Fibromyalgia Daughter   . Stroke Maternal Grandmother     Social History   Social History  . Marital Status: Single    Spouse Name: N/A  . Number of Children: 1  . Years of Education: N/A   Occupational History  .     Social History Main Topics  . Smoking status: Never Smoker   . Smokeless tobacco: Never Used  . Alcohol Use: 4.2 oz/week    7 Glasses of wine  per week     Comment: 1 glass wine daily  . Drug Use: No  . Sexual Activity: Not on file   Other Topics Concern  . 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 acetaminophen (TYLENOL) 500 MG tablet Take 1,000 mg by mouth every morning.    Marland Kitchen aspirin 81 MG tablet Take 81 mg by mouth daily.      . Calcium-Magnesium-Vitamin D (CALCIUM MAGNESIUM PO) Take 1 tablet by mouth daily.     . Coenzyme Q10 (COQ10 PO) Take 2 tablets by mouth daily.    . Diphenhydramine-APAP, sleep, (TYLENOL PM EXTRA STRENGTH PO) Take 2 tablets by mouth at bedtime.    Marland Kitchen latanoprost (XALATAN) 0.005 % ophthalmic solution Place 1 drop into the right eye  daily. 2.5 mL 2  . naproxen sodium (ANAPROX) 220 MG tablet Take 220 mg by mouth daily.    . Omega-3 Fatty Acids (FISH OIL) 1000 MG CAPS Take 1 capsule by mouth daily.     Marland Kitchen senna-docusate (SENOKOT-S) 8.6-50 MG per tablet Take 1 tablet by mouth daily as needed for mild constipation.    Marland Kitchen tolterodine (DETROL) 2 MG tablet TAKE 1 TABLET BY MOUTH TWICE DAILY 60 tablet 2  . Vitamin D, Ergocalciferol, (DRISDOL) 50000 UNITS CAPS capsule Take 1 capsule (50,000 Units total) by mouth every 7 (seven) days. 12 capsule 0   No facility-administered medications prior to visit.    No Known Allergies  Review of Systems  Constitutional: Negative for fever, chills and malaise/fatigue.  HENT: Negative for congestion and hearing loss.   Eyes: Negative for discharge.  Respiratory: Negative for cough, sputum production and shortness of breath.   Cardiovascular: Negative for chest pain, palpitations and leg swelling.  Gastrointestinal: Negative for heartburn, nausea, vomiting, abdominal pain, diarrhea,  constipation and blood in stool.  Genitourinary: Positive for frequency. Negative for dysuria, urgency and hematuria.  Musculoskeletal: Negative for myalgias, back pain and falls.  Skin: Negative for rash.  Neurological: Negative for dizziness, sensory change, loss of consciousness, weakness and headaches.  Endo/Heme/Allergies: Negative for environmental allergies. Does not bruise/bleed easily.  Psychiatric/Behavioral: Negative for depression and suicidal ideas. The patient is not nervous/anxious and does not have insomnia.        Objective:    Physical Exam  Constitutional: She is oriented to person, place, and time. She appears well-developed and well-nourished. No distress.  HENT:  Head: Normocephalic and atraumatic.  Eyes: Conjunctivae are normal.  Neck: Neck supple. No thyromegaly present.  Cardiovascular: Normal rate, regular rhythm and normal heart sounds.   No murmur heard. Pulmonary/Chest: Effort normal and breath sounds normal. No respiratory distress.  Abdominal: Soft. Bowel sounds are normal. She exhibits no distension and no mass. There is no tenderness.  Musculoskeletal: She exhibits no edema.  Lymphadenopathy:    She has no cervical adenopathy.  Neurological: She is alert and oriented to person, place, and time.  Skin: Skin is warm and dry.  Psychiatric: She has a normal mood and affect. Her behavior is normal.    BP 125/67 mmHg  Pulse 87  Temp(Src) 97.4 F (36.3 C) (Oral)  Resp 14  Ht '5\' 2"'  (1.575 m)  Wt 123 lb 6.4 oz (55.974 kg)  BMI 22.56 kg/m2  SpO2 99% Wt Readings from Last 3 Encounters:  11/13/15 123 lb 6.4 oz (55.974 kg)  08/22/15 126 lb (57.153 kg)  06/28/15 124 lb 4.8 oz (56.382 kg)     Lab Results  Component Value Date   WBC 13.1* 06/28/2015   HGB 12.9 06/28/2015   HCT 38.5 06/28/2015   PLT 274 06/28/2015   GLUCOSE 148* 06/28/2015   ALT 19 06/28/2015   AST 21 06/28/2015   NA 140 06/28/2015   K 4.3 06/28/2015   CL 102 12/06/2014    CREATININE 0.9 06/28/2015   BUN 18.8 06/28/2015   CO2 27 06/28/2015   HGBA1C 5.5 12/24/2012    No results found for: TSH Lab Results  Component Value Date   WBC 13.1* 06/28/2015   HGB 12.9 06/28/2015   HCT 38.5 06/28/2015   MCV 93.0 06/28/2015   PLT 274 06/28/2015   Lab Results  Component Value Date   NA 140 06/28/2015   K 4.3 06/28/2015   CHLORIDE 104 06/28/2015   CO2  27 06/28/2015   GLUCOSE 148* 06/28/2015   BUN 18.8 06/28/2015   CREATININE 0.9 06/28/2015   BILITOT 0.60 06/28/2015   ALKPHOS 55 06/28/2015   AST 21 06/28/2015   ALT 19 06/28/2015   PROT 6.5 06/28/2015   ALBUMIN 3.9 06/28/2015   CALCIUM 9.2 06/28/2015   ANIONGAP 8 06/28/2015   EGFR 54* 06/28/2015   GFR 71.97 12/06/2014   No results found for: CHOL No results found for: HDL No results found for: LDLCALC No results found for: TRIG No results found for: CHOLHDL Lab Results  Component Value Date   HGBA1C 5.5 12/24/2012       Assessment & Plan:   Problem List Items Addressed This Visit    Essential hypertension    Well controlled, no changes to meds. Encouraged heart healthy diet such as the DASH diet and exercise as tolerated.       Relevant Orders   Comprehensive metabolic panel   TSH   Hyperglycemia - Primary     minimize simple carbs. Increase exercise as tolerated.       Relevant Orders   Hemoglobin A1c   Insomnia    Encouraged good sleep hygiene such as dark, quiet room. No blue/green glowing lights such as computer screens in bedroom. No alcohol or stimulants in evening. Cut down on caffeine as able. Regular exercise is helpful but not just prior to bed time.       Skin cancer, upper extremity   Urinary incontinence    Improved control on Detrol      Vitamin D deficiency    Labs reveal deficiency. Start on Vitamin D 50000 IU caps, 1 cap po weekly x 12 weeks. Disp #4 with 4 rf. Also take daily Vitamin D over the counter. If already taking a daily supplement increase by 1000 IU  daily and if not start Vitamin D 2000 IU daily.       Relevant Orders   Vitamin D (25 hydroxy)      I am having Ms. Winfield maintain her aspirin, Fish Oil, Calcium-Magnesium-Vitamin D (CALCIUM MAGNESIUM PO), naproxen sodium, Coenzyme Q10 (COQ10 PO), latanoprost, senna-docusate, acetaminophen, (Diphenhydramine-APAP, sleep, (TYLENOL PM EXTRA STRENGTH PO)), Vitamin D (Ergocalciferol), and tolterodine.  No orders of the defined types were placed in this encounter.     Penni Homans, MD

## 2015-11-26 NOTE — Assessment & Plan Note (Signed)
Well controlled, no changes to meds. Encouraged heart healthy diet such as the DASH diet and exercise as tolerated.  °

## 2015-11-26 NOTE — Assessment & Plan Note (Signed)
Labs reveal deficiency. Start on Vitamin D 50000 IU caps, 1 cap po weekly x 12 weeks. Disp #4 with 4 rf. Also take daily Vitamin D over the counter. If already taking a daily supplement increase by 1000 IU daily and if not start Vitamin D 2000 IU daily.  

## 2015-11-26 NOTE — Assessment & Plan Note (Signed)
minimize simple carbs. Increase exercise as tolerated.  

## 2015-11-26 NOTE — Assessment & Plan Note (Signed)
Encouraged good sleep hygiene such as dark, quiet room. No blue/green glowing lights such as computer screens in bedroom. No alcohol or stimulants in evening. Cut down on caffeine as able. Regular exercise is helpful but not just prior to bed time.  

## 2015-11-26 NOTE — Assessment & Plan Note (Signed)
Improved control on Detrol

## 2015-11-29 DIAGNOSIS — L820 Inflamed seborrheic keratosis: Secondary | ICD-10-CM | POA: Diagnosis not present

## 2015-11-29 DIAGNOSIS — D1801 Hemangioma of skin and subcutaneous tissue: Secondary | ICD-10-CM | POA: Diagnosis not present

## 2015-11-29 DIAGNOSIS — L814 Other melanin hyperpigmentation: Secondary | ICD-10-CM | POA: Diagnosis not present

## 2015-11-29 DIAGNOSIS — L821 Other seborrheic keratosis: Secondary | ICD-10-CM | POA: Diagnosis not present

## 2015-12-19 ENCOUNTER — Other Ambulatory Visit (INDEPENDENT_AMBULATORY_CARE_PROVIDER_SITE_OTHER): Payer: Medicare Other

## 2015-12-19 ENCOUNTER — Other Ambulatory Visit: Payer: Self-pay | Admitting: Family Medicine

## 2015-12-19 DIAGNOSIS — E559 Vitamin D deficiency, unspecified: Secondary | ICD-10-CM | POA: Diagnosis not present

## 2015-12-19 DIAGNOSIS — I10 Essential (primary) hypertension: Secondary | ICD-10-CM | POA: Diagnosis not present

## 2015-12-19 DIAGNOSIS — R739 Hyperglycemia, unspecified: Secondary | ICD-10-CM | POA: Diagnosis not present

## 2015-12-19 LAB — TSH: TSH: 1.17 u[IU]/mL (ref 0.35–4.50)

## 2015-12-19 LAB — COMPREHENSIVE METABOLIC PANEL
ALBUMIN: 4.1 g/dL (ref 3.5–5.2)
ALT: 11 U/L (ref 0–35)
AST: 16 U/L (ref 0–37)
Alkaline Phosphatase: 49 U/L (ref 39–117)
BUN: 32 mg/dL — AB (ref 6–23)
CO2: 29 mEq/L (ref 19–32)
CREATININE: 0.88 mg/dL (ref 0.40–1.20)
Calcium: 9.2 mg/dL (ref 8.4–10.5)
Chloride: 103 mEq/L (ref 96–112)
GFR: 64.32 mL/min (ref >60.00)
Glucose, Bld: 105 mg/dL — ABNORMAL HIGH (ref 70–99)
Potassium: 3.9 mEq/L (ref 3.5–5.1)
Sodium: 138 mEq/L (ref 135–145)
Total Bilirubin: 0.6 mg/dL (ref 0.2–1.2)
Total Protein: 6.7 g/dL (ref 6.0–8.3)

## 2015-12-19 LAB — HEMOGLOBIN A1C: Hgb A1c MFr Bld: 5.9 % (ref 4.6–6.5)

## 2015-12-19 LAB — VITAMIN D 25 HYDROXY (VIT D DEFICIENCY, FRACTURES): VITD: 22.36 ng/mL — ABNORMAL LOW (ref 30.00–100.00)

## 2015-12-19 MED ORDER — VITAMIN D (ERGOCALCIFEROL) 1.25 MG (50000 UNIT) PO CAPS: 50000.0000 [IU] | ORAL_CAPSULE | ORAL | Status: DC

## 2015-12-21 ENCOUNTER — Encounter: Payer: Self-pay | Admitting: Family Medicine

## 2015-12-21 ENCOUNTER — Ambulatory Visit (INDEPENDENT_AMBULATORY_CARE_PROVIDER_SITE_OTHER): Payer: Medicare Other | Admitting: Family Medicine

## 2015-12-21 VITALS — BP 120/72 | HR 78 | Temp 97.7°F | Ht 62.0 in | Wt 127.4 lb

## 2015-12-21 DIAGNOSIS — M810 Age-related osteoporosis without current pathological fracture: Secondary | ICD-10-CM | POA: Diagnosis not present

## 2015-12-21 DIAGNOSIS — E559 Vitamin D deficiency, unspecified: Secondary | ICD-10-CM

## 2015-12-21 DIAGNOSIS — G3184 Mild cognitive impairment, so stated: Secondary | ICD-10-CM

## 2015-12-21 DIAGNOSIS — R739 Hyperglycemia, unspecified: Secondary | ICD-10-CM

## 2015-12-21 MED ORDER — TOLTERODINE TARTRATE ER 2 MG PO CP24: 2.0000 mg | ORAL_CAPSULE | Freq: Every day | ORAL | Status: DC

## 2015-12-21 NOTE — Progress Notes (Signed)
Pre visit review using our clinic review tool, if applicable. No additional management support is needed unless otherwise documented below in the visit note. 

## 2015-12-21 NOTE — Assessment & Plan Note (Signed)
Encouraged increased rest and hydration, add probiotics, zinc such as Coldeze or Xicam. Treat fevers as needed 

## 2015-12-22 ENCOUNTER — Encounter: Payer: Medicare Other | Admitting: Family Medicine

## 2016-01-07 ENCOUNTER — Encounter: Payer: Self-pay | Admitting: Family Medicine

## 2016-01-07 DIAGNOSIS — G3184 Mild cognitive impairment, so stated: Secondary | ICD-10-CM

## 2016-01-07 HISTORY — DX: Mild cognitive impairment of uncertain or unknown etiology: G31.84

## 2016-01-07 NOTE — Progress Notes (Signed)
Patient ID: Alison Hammond, female   DOB: [birth year], 82 y.o.   MRN: [id number]   Subjective:    Patient ID: Alison Hammond, female    DOB: 1925/04/12, 62 y.o.   MRN: [id number]  Chief Complaint  Patient presents with  . Follow-up    HPI Patient is in today for follow up. She is feeling well today. She is somewhat concerned about her memory and as is her family. She is not getting lost or having trouble with basic ADLs but is struggling with STR. No recent illnee or acute concerns otherwise. Denies CP/palp/SOB/HA/congestion/fevers/GI or GU c/o. Taking meds as prescribed  Past Medical History  Diagnosis Date  . Osteoporosis   . Chronic low back pain   . DDD (degenerative disc disease), lumbar   . Chronic lymphocytic leukemia ([place])   . Hx of adenomatous colonic polyps     no polyps in 1/08  . Hyperlipidemia   . Glaucoma   . Heart murmur   . History of blood transfusion     due to fracture of femur  . Insomnia 06/27/2014  . Skin cancer   . Squamous cell carcinoma ([place])   . Mild cognitive impairment 01/07/2016    Past Surgical History  Procedure Laterality Date  . Breast enhancement surgery  1978, 7829    with silicone  . Knee arthroscopy  1996    right  . Repair of crushed femur  1997  . Cataract extraction      right eye. Also has bilateral eye implants for distance and close up vision.  . Abdominal hysterectomy  2013    at [facility]  . Skin cancer excision Right 12.22.16    Squamous Cell Carcinoma; [place]    Family History  Problem Relation Age of Onset  . Colon cancer Mother     Died from colon caner   . Esophageal cancer Father   . Arthritis Father   . Heart disease Father   . Fibromyalgia Daughter   . Stroke Maternal Grandmother     Social History   Social History  . Marital Status: Single    Spouse Name: N/A  . Number of Children: 1  . Years of Education: N/A   Occupational History  .     Social History Main Topics  . Smoking status: Never Smoker   .  Smokeless tobacco: Never Used  . Alcohol Use: 4.2 oz/week    7 Glasses of wine per week     Comment: 1 glass wine daily  . Drug Use: No  . Sexual Activity: Not on file   Other Topics Concern  . 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 acetaminophen (TYLENOL) 500 MG tablet Take 1,000 mg by mouth every morning.    Marland Kitchen aspirin 81 MG tablet Take 81 mg by mouth daily.      . Cholecalciferol (VITAMIN D) 2000 units CAPS Take 1 capsule by mouth daily.    . Diphenhydramine-APAP, sleep, (TYLENOL PM EXTRA STRENGTH PO) Take 2 tablets by mouth at bedtime.    Marland Kitchen latanoprost (XALATAN) 0.005 % ophthalmic solution Place 1 drop into the right eye daily.  2.5 mL 2  . naproxen sodium (ANAPROX) 220 MG tablet Take 220 mg by mouth daily.    . Vitamin D, Ergocalciferol, (DRISDOL) 50000 units CAPS capsule Take 1 capsule (50,000 Units total) by mouth every 7 (seven) days. 12 capsule 0  . Calcium-Magnesium-Vitamin D (CALCIUM MAGNESIUM PO) Take 1 tablet by mouth daily. Reported on 12/21/2015    . senna-docusate (SENOKOT-S) 8.6-50 MG per tablet Take 1 tablet by mouth daily as needed for mild constipation.    . Coenzyme Q10 (COQ10 PO) Take 2 tablets by mouth daily. Reported on 12/21/2015    . Omega-3 Fatty Acids (FISH OIL) 1000 MG CAPS Take 1 capsule by mouth daily.     Marland Kitchen tolterodine (DETROL) 2 MG tablet TAKE 1 TABLET BY MOUTH TWICE DAILY 60 tablet 2   No facility-administered medications prior to visit.    No Known Allergies  Review of Systems  Constitutional: Negative for fever and malaise/fatigue.  HENT: Negative for congestion.   Eyes: Negative for blurred vision.  Respiratory: Negative for shortness of breath.   Cardiovascular:  Negative for chest pain, palpitations and leg swelling.  Gastrointestinal: Negative for nausea, abdominal pain and blood in stool.  Genitourinary: Negative for dysuria and frequency.  Musculoskeletal: Negative for falls.  Skin: Negative for rash.  Neurological: Negative for dizziness, loss of consciousness and headaches.  Endo/Heme/Allergies: Negative for environmental allergies.  Psychiatric/Behavioral: Positive for memory loss. Negative for depression. The patient is not nervous/anxious.        Objective:    Physical Exam  Constitutional: She is oriented to person, place, and time. She appears well-developed and well-nourished. No distress.  HENT:  Head: Normocephalic and atraumatic.  Nose: Nose normal.  Eyes: Right eye exhibits no discharge. Left eye exhibits no discharge.  Neck: Normal range of motion. Neck supple.  Cardiovascular: Normal rate and regular rhythm.   Pulmonary/Chest: Effort normal and breath sounds normal.  Abdominal: Soft. Bowel sounds are normal. There is no tenderness.  Musculoskeletal: She exhibits no edema.  Neurological: She is alert and oriented to person, place, and time.  Skin: Skin is warm and dry.  Psychiatric: She has a normal mood and affect.  Nursing note and vitals reviewed.   BP 120/72 mmHg  Pulse 78  Temp(Src) 97.7 F (36.5 C) (Oral)  Ht '5\' 2"'  (1.575 m)  Wt 127 lb 6 oz (57.777 kg)  BMI 23.29 kg/m2  SpO2 96% Wt Readings from Last 3 Encounters:  12/21/15 127 lb 6 oz (57.777 kg)  11/13/15 123 lb 6.4 oz (55.974 kg)  08/22/15 126 lb (57.153 kg)     Lab Results  Component Value Date   WBC 13.1* 06/28/2015   HGB 12.9 06/28/2015   HCT 38.5 06/28/2015   PLT 274 06/28/2015   GLUCOSE 105* 12/19/2015   ALT 11 12/19/2015   AST 16 12/19/2015   NA 138 12/19/2015   K 3.9 12/19/2015   CL 103 12/19/2015   CREATININE 0.88 12/19/2015   BUN 32* 12/19/2015   CO2 29 12/19/2015   TSH 1.17 12/19/2015   HGBA1C 5.9 12/19/2015    Lab Results    Component Value Date   TSH 1.17 12/19/2015   Lab Results  Component Value Date   WBC 13.1* 06/28/2015   HGB 12.9 06/28/2015   HCT 38.5 06/28/2015   MCV 93.0 06/28/2015   PLT 274 06/28/2015   Lab Results  Component Value Date   NA 138 12/19/2015   K 3.9 12/19/2015   CHLORIDE 104 06/28/2015  CO2 29 12/19/2015   GLUCOSE 105* 12/19/2015   BUN 32* 12/19/2015   CREATININE 0.88 12/19/2015   BILITOT 0.6 12/19/2015   ALKPHOS 49 12/19/2015   AST 16 12/19/2015   ALT 11 12/19/2015   PROT 6.7 12/19/2015   ALBUMIN 4.1 12/19/2015   CALCIUM 9.2 12/19/2015   ANIONGAP 8 06/28/2015   EGFR 54* 06/28/2015   GFR 64.32 12/19/2015   No results found for: CHOL No results found for: HDL No results found for: LDLCALC No results found for: TRIG No results found for: CHOLHDL Lab Results  Component Value Date   HGBA1C 5.9 12/19/2015       Assessment & Plan:   Problem List Items Addressed This Visit    Hyperglycemia    minimize simple carbs. Increase exercise as tolerated.       Mild cognitive impairment    Patient MMSE is 24/30. Encouraged brain gym, heart heathy diet and staying active, will monitor and consider meds if worsens.       SENILE OSTEOPOROSIS    Recommend calcium intake of 1200 to 1500 mg daily, divided into roughly 3 doses. Best source is the diet and a single dairy serving is about 500 mg, a supplement of calcium citrate once or twice daily to balance diet is fine if not getting enough in diet. Also need Vitamin D 2000 IU caps, 1 cap daily if not already taking vitamin D. Also recommend weight baring exercise on hips and upper body to keep bones strong      Vitamin D deficiency - Primary (Chronic)    Encouraged increased rest and hydration, add probiotics, zinc such as Coldeze or Xicam. Treat fevers as needed         I have discontinued Ms. Hrdlicka's Fish Oil, Coenzyme Q10 (COQ10 PO), and tolterodine. I am also having her start on tolterodine. Additionally, I am  having her maintain her aspirin, Calcium-Magnesium-Vitamin D (CALCIUM MAGNESIUM PO), naproxen sodium, latanoprost, senna-docusate, acetaminophen, (Diphenhydramine-APAP, sleep, (TYLENOL PM EXTRA STRENGTH PO)), Vitamin D (Ergocalciferol), and Vitamin D.  Meds ordered this encounter  Medications  . tolterodine (DETROL LA) 2 MG 24 hr capsule    Sig: Take 1 capsule (2 mg total) by mouth daily.    Dispense:  30 capsule    Refill:  3     Penni Homans, MD

## 2016-01-07 NOTE — Assessment & Plan Note (Signed)
minimize simple carbs. Increase exercise as tolerated.  

## 2016-01-07 NOTE — Assessment & Plan Note (Signed)
Recommend calcium intake of 1200 to 1500 mg daily, divided into roughly 3 doses. Best source is the diet and a single dairy serving is about 500 mg, a supplement of calcium citrate once or twice daily to balance diet is fine if not getting enough in diet. Also need Vitamin D 2000 IU caps, 1 cap daily if not already taking vitamin D. Also recommend weight baring exercise on hips and upper body to keep bones strong 

## 2016-01-07 NOTE — Assessment & Plan Note (Signed)
Patient MMSE is 24/30. Encouraged brain gym, heart heathy diet and staying active, will monitor and consider meds if worsens.

## 2016-02-20 ENCOUNTER — Telehealth: Payer: Self-pay | Admitting: Family Medicine

## 2016-02-20 DIAGNOSIS — N390 Urinary tract infection, site not specified: Secondary | ICD-10-CM | POA: Diagnosis not present

## 2016-02-20 NOTE — Telephone Encounter (Signed)
Faxed order to nursing home, daughter infomred

## 2016-02-20 NOTE — Telephone Encounter (Signed)
Caller name: Lyn Relationship to patient: Daughter Can be reached:  [phone]  Reason for call: Daughter request that an order be sent [facility] for patient to get a UA. Daughter thinks she has a UTI. Fax number to [facility] is [phone]. States she is not having any symptoms but is having some incontinence.

## 2016-02-20 NOTE — Telephone Encounter (Signed)
Ok to send an order over to [facility] for UA with [facility] for urinary incontinence

## 2016-02-23 NOTE — Telephone Encounter (Signed)
Results so far negative awaiting culture results. Called the daughter left a detailed message of results so far.

## 2016-02-27 MED ORDER — CEFDINIR 300 MG PO CAPS: 300.0000 mg | ORAL_CAPSULE | Freq: Two times a day (BID) | ORAL | Status: DC

## 2016-02-27 NOTE — Telephone Encounter (Signed)
Results came in from [facility] and sent to scan.

## 2016-02-27 NOTE — Addendum Note (Signed)
Addended by: Sharon Seller B on: 02/27/2016 02:31 PM   Modules accepted: Orders, Medications

## 2016-02-27 NOTE — Telephone Encounter (Signed)
PCP received culture results.Postive  Results for infection Sent in Cefdinir 300 mg BID x 5 days = #10 to [facility] in [facility]. Daughter has been informed of results/prescription sent in.

## 2016-03-07 ENCOUNTER — Telehealth: Payer: Self-pay | Admitting: Family Medicine

## 2016-03-07 NOTE — Telephone Encounter (Signed)
PCP did ok and sent order to [facility] at [phone] Daughter called to be informed order being faxed to nursing home

## 2016-03-07 NOTE — Telephone Encounter (Signed)
OK to send order to [facility] for UA c&s for UTI

## 2016-03-07 NOTE — Telephone Encounter (Signed)
Caller name: Augustin Coupe Relationship to patient: daughter Can be reached: [phone]  Reason for call: Pt has completed abx for UTI. She is wanting pt to be checked to make sure UTI resolved. Please send order to [facility] to do another UA. Please call Augustin Coupe to let her know when sent.

## 2016-03-08 DIAGNOSIS — N390 Urinary tract infection, site not specified: Secondary | ICD-10-CM | POA: Diagnosis not present

## 2016-03-11 NOTE — Telephone Encounter (Signed)
Results received and placed in red folder for Dr. Charlett Blake. JG//CMA

## 2016-03-11 NOTE — Telephone Encounter (Signed)
PCP received urine results.  Results were negative, no obvious infection, no treatment needed. Called the daughter informed of results/instructions.

## 2016-03-25 ENCOUNTER — Encounter: Payer: Self-pay | Admitting: Family Medicine

## 2016-03-25 IMAGING — DX DG LUMBAR SPINE COMPLETE 4+V
5 series · 5 of 5 positions shown · non-contrast
Comparison: MRI of the lumbar spine 03/01/2009

CLINICAL DATA: Low back pain in tenderness.

EXAM:
LUMBAR SPINE - COMPLETE 4+ VIEW

[l-spine ap]
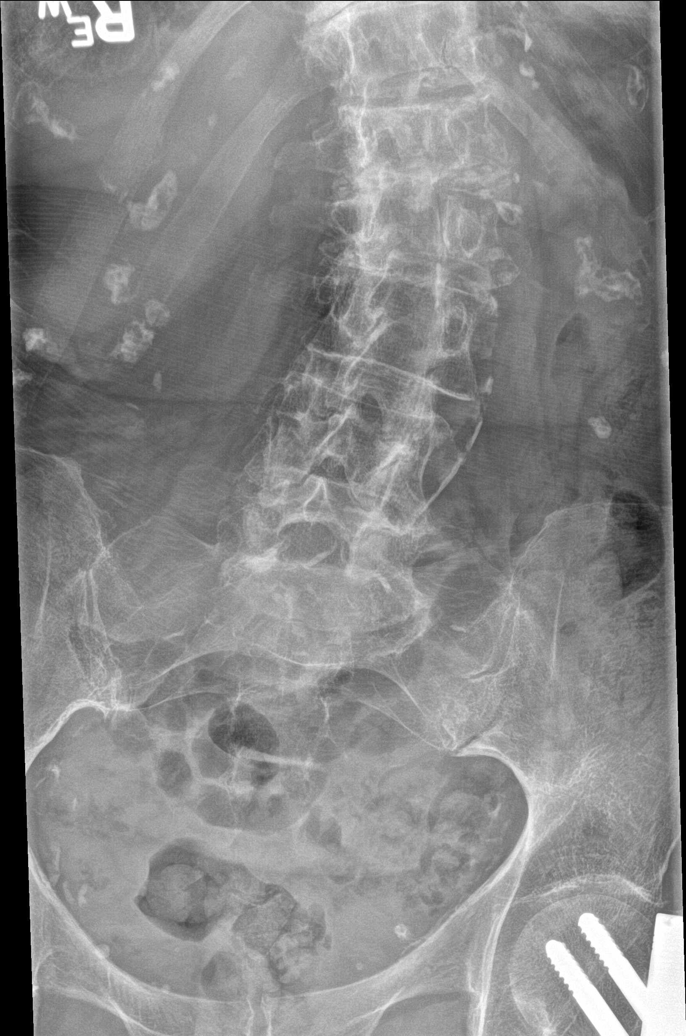

[l-spine obl (1 of 2)]
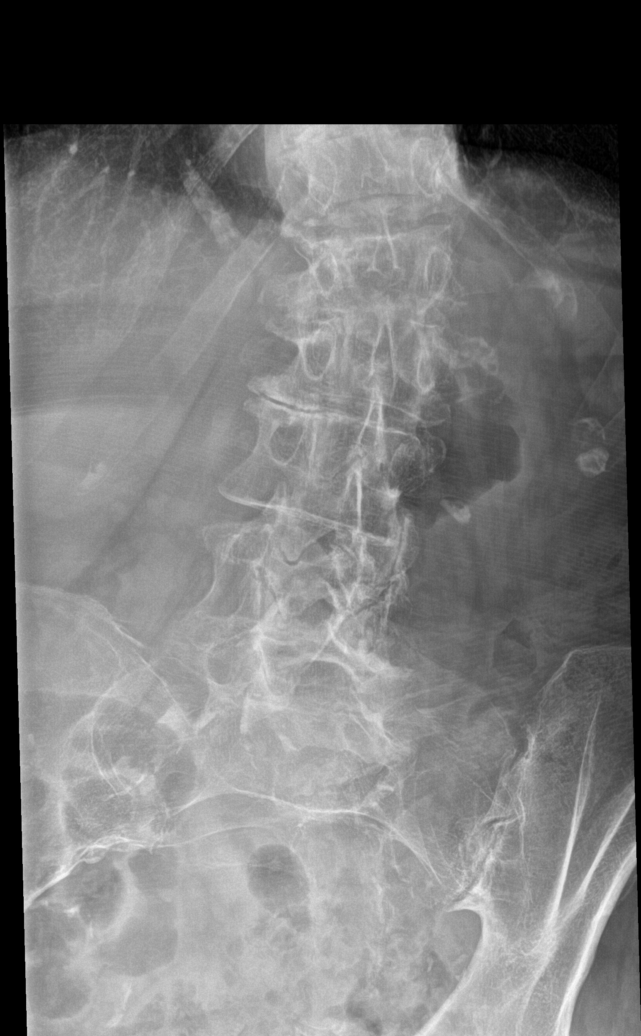

[l-spine obl (2 of 2)]
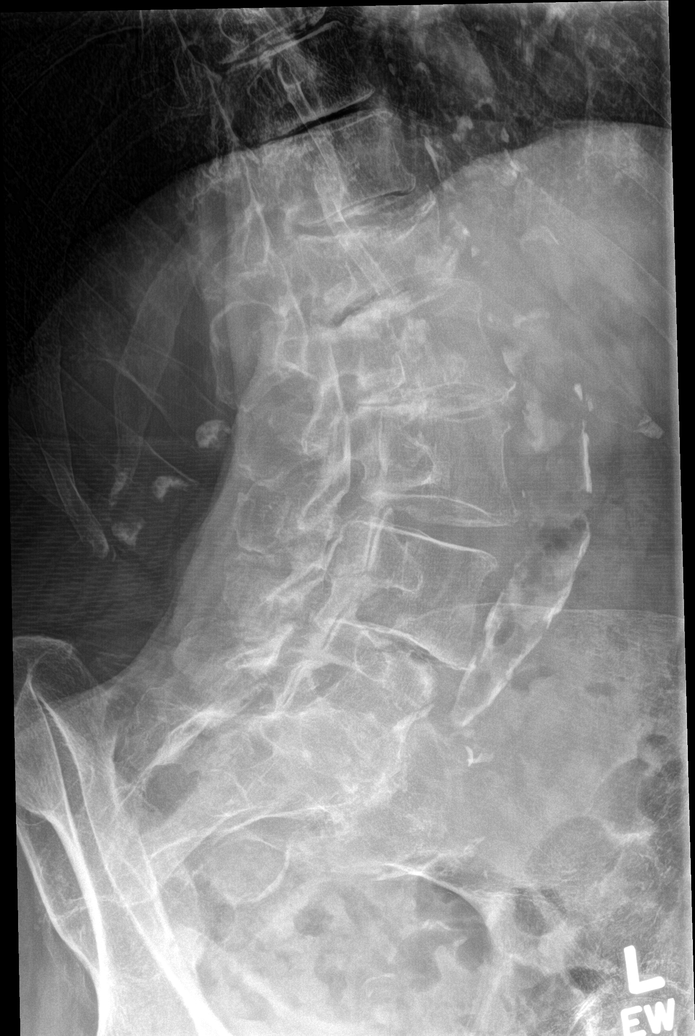

[l-spine lat]
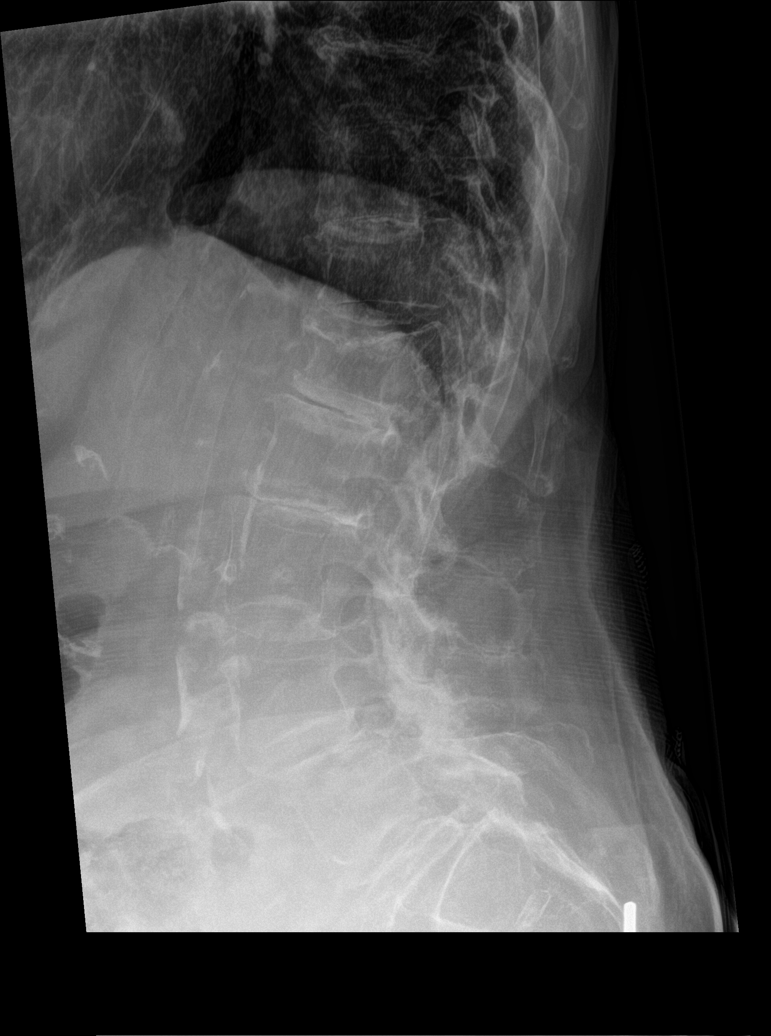

[l-spine spot]
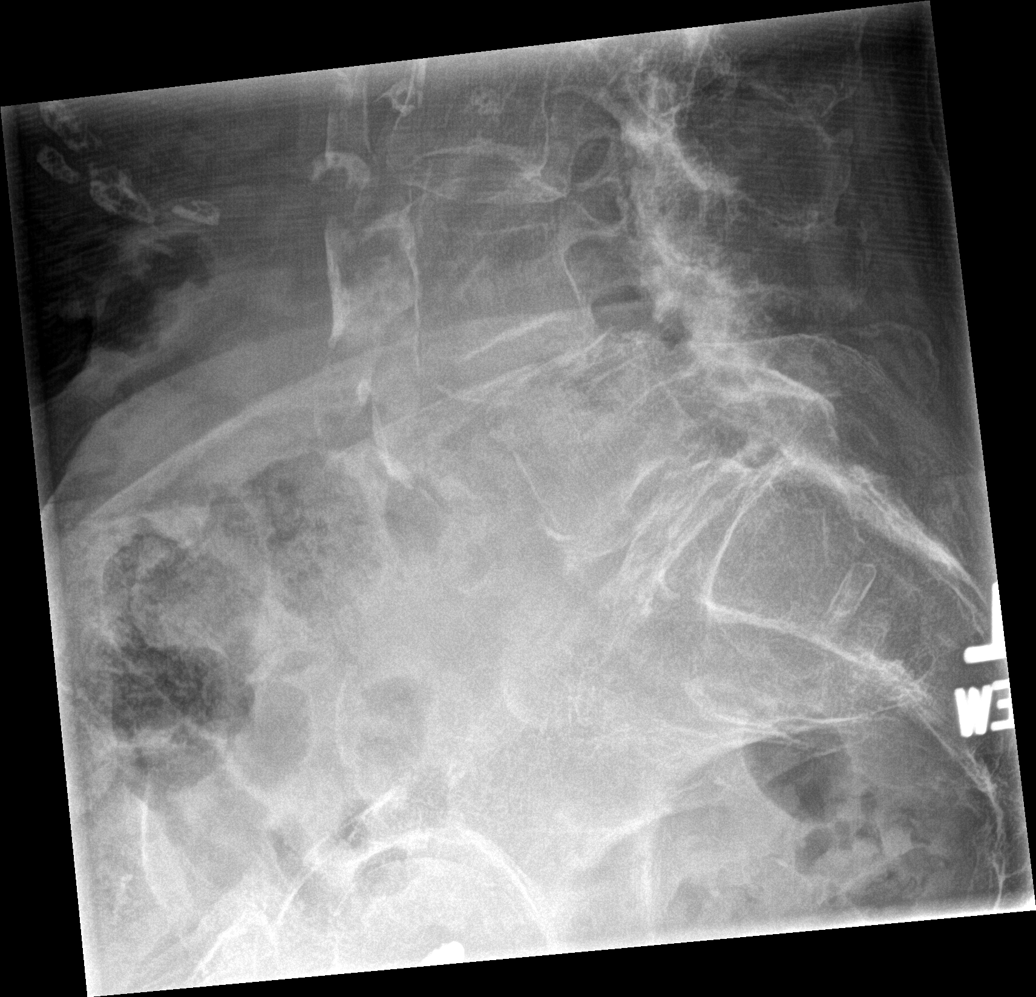

[5 of 5 positions shown; findings below may reference images not displayed]

FINDINGS: Levoconvex curvature of the lumbar spine is again noted. Asymmetric
right-sided endplate changes are present at L1-2 and L2-3. A remote
L1 compression fracture is again noted. Grade 1 anterolisthesis at
L4-5 is stable. Atherosclerotic calcifications are present in the
aorta without aneurysm. Advanced facet hypertrophy is noted.
Moderate osteopenia is evident.
IMPRESSION: 1. Remote L1-2 compression fracture.
2. Levoconvex curvature of the lumbar spine with asymmetric
right-sided endplate changes at L1-2 and L2-3.
3. Stable grade 1 anterolisthesis at L4-5.
4. Atherosclerosis.

## 2016-04-17 DIAGNOSIS — S99922A Unspecified injury of left foot, initial encounter: Secondary | ICD-10-CM | POA: Diagnosis not present

## 2016-04-17 DIAGNOSIS — S93492A Sprain of other ligament of left ankle, initial encounter: Secondary | ICD-10-CM | POA: Diagnosis not present

## 2016-04-17 DIAGNOSIS — M79672 Pain in left foot: Secondary | ICD-10-CM | POA: Diagnosis not present

## 2016-04-17 DIAGNOSIS — S90122A Contusion of left lesser toe(s) without damage to nail, initial encounter: Secondary | ICD-10-CM | POA: Diagnosis not present

## 2016-04-17 DIAGNOSIS — M25572 Pain in left ankle and joints of left foot: Secondary | ICD-10-CM | POA: Diagnosis not present

## 2016-04-17 DIAGNOSIS — S93422A Sprain of deltoid ligament of left ankle, initial encounter: Secondary | ICD-10-CM | POA: Diagnosis not present

## 2016-04-17 DIAGNOSIS — M12572 Traumatic arthropathy, left ankle and foot: Secondary | ICD-10-CM | POA: Diagnosis not present

## 2016-04-17 DIAGNOSIS — S93412A Sprain of calcaneofibular ligament of left ankle, initial encounter: Secondary | ICD-10-CM | POA: Diagnosis not present

## 2016-04-17 DIAGNOSIS — S99912A Unspecified injury of left ankle, initial encounter: Secondary | ICD-10-CM | POA: Diagnosis not present

## 2016-04-25 DIAGNOSIS — R17 Unspecified jaundice: Secondary | ICD-10-CM | POA: Diagnosis not present

## 2016-04-25 DIAGNOSIS — C9110 Chronic lymphocytic leukemia of B-cell type not having achieved remission: Secondary | ICD-10-CM | POA: Diagnosis not present

## 2016-04-25 DIAGNOSIS — Z8579 Personal history of other malignant neoplasms of lymphoid, hematopoietic and related tissues: Secondary | ICD-10-CM | POA: Diagnosis not present

## 2016-04-26 ENCOUNTER — Ambulatory Visit (INDEPENDENT_AMBULATORY_CARE_PROVIDER_SITE_OTHER): Payer: Medicare Other | Admitting: Family Medicine

## 2016-04-26 ENCOUNTER — Encounter: Payer: Self-pay | Admitting: Family Medicine

## 2016-04-26 DIAGNOSIS — R739 Hyperglycemia, unspecified: Secondary | ICD-10-CM

## 2016-04-26 DIAGNOSIS — I10 Essential (primary) hypertension: Secondary | ICD-10-CM

## 2016-04-26 DIAGNOSIS — H409 Unspecified glaucoma: Secondary | ICD-10-CM | POA: Diagnosis not present

## 2016-04-26 NOTE — Patient Instructions (Signed)
Ankle Sprain  An ankle sprain is an injury to the strong, fibrous tissues (ligaments) that hold the bones of your ankle joint together.   CAUSES  An ankle sprain is usually caused by a fall or by twisting your ankle. Ankle sprains most commonly occur when you step on the outer edge of your foot, and your ankle turns inward. People who participate in sports are more prone to these types of injuries.   SYMPTOMS    Pain in your ankle. The pain may be present at rest or only when you are trying to stand or walk.   Swelling.   Bruising. Bruising may develop immediately or within 1 to 2 days after your injury.   Difficulty standing or walking, particularly when turning corners or changing directions.  DIAGNOSIS   Your caregiver will ask you details about your injury and perform a physical exam of your ankle to determine if you have an ankle sprain. During the physical exam, your caregiver will press on and apply pressure to specific areas of your foot and ankle. Your caregiver will try to move your ankle in certain ways. An X-ray exam may be done to be sure a bone was not broken or a ligament did not separate from one of the bones in your ankle (avulsion fracture).   TREATMENT   Certain types of braces can help stabilize your ankle. Your caregiver can make a recommendation for this. Your caregiver may recommend the use of medicine for pain. If your sprain is severe, your caregiver may refer you to a surgeon who helps to restore function to parts of your skeletal system (orthopedist) or a physical therapist.  HOME CARE INSTRUCTIONS    Apply ice to your injury for 1-2 days or as directed by your caregiver. Applying ice helps to reduce inflammation and pain.    Put ice in a plastic bag.    Place a towel between your skin and the bag.    Leave the ice on for 15-20 minutes at a time, every 2 hours while you are awake.   Only take over-the-counter or prescription medicines for pain, discomfort, or fever as directed by  your caregiver.   Elevate your injured ankle above the level of your heart as much as possible for 2-3 days.   If your caregiver recommends crutches, use them as instructed. Gradually put weight on the affected ankle. Continue to use crutches or a cane until you can walk without feeling pain in your ankle.   If you have a plaster splint, wear the splint as directed by your caregiver. Do not rest it on anything harder than a pillow for the first 24 hours. Do not put weight on it. Do not get it wet. You may take it off to take a shower or bath.   You may have been given an elastic bandage to wear around your ankle to provide support. If the elastic bandage is too tight (you have numbness or tingling in your foot or your foot becomes cold and blue), adjust the bandage to make it comfortable.   If you have an air splint, you may blow more air into it or let air out to make it more comfortable. You may take your splint off at night and before taking a shower or bath. Wiggle your toes in the splint several times per day to decrease swelling.  SEEK MEDICAL CARE IF:    You have rapidly increasing bruising or swelling.   Your toes feel   extremely cold or you lose feeling in your foot.   Your pain is not relieved with medicine.  SEEK IMMEDIATE MEDICAL CARE IF:   Your toes are numb or blue.   You have severe pain that is increasing.  MAKE SURE YOU:    Understand these instructions.   Will watch your condition.   Will get help right away if you are not doing well or get worse.     This information is not intended to replace advice given to you by your health care provider. Make sure you discuss any questions you have with your health care provider.     Document Released: 09/02/2005 Document Revised: 09/23/2014 Document Reviewed: 09/14/2011  Elsevier Interactive Patient Education 2016 Elsevier Inc[employer]

## 2016-04-26 NOTE — Progress Notes (Signed)
Pre visit review using our clinic review tool, if applicable. No additional management support is needed unless otherwise documented below in the visit note. 

## 2016-05-05 NOTE — Assessment & Plan Note (Signed)
Seen in urgent care with concerns over yellowing in eyes. Work up for hepatitis and liver disease was reviewed with patient since they had not heard yet. No concerns were identified and exam was unremarkable today. Reassurance was given.

## 2016-05-05 NOTE — Progress Notes (Signed)
Patient ID: Alison Hammond, female   DOB: 1925-04-26, 82 y.o.   MRN: [id number]   Subjective:    Patient ID: Alison Hammond, female    DOB: Jun 16, 1925, 82 y.o.   MRN: [id number]  Chief Complaint  Patient presents with  . Follow-up    yellow eyes    HPI Patient is in today for urgent care visit follow up. She is accompanied y her daughter. They presented to urgent care with concerns her eyes were yellowing. They deny any other complaints such as fever, tylenol use, abdominal pain. Denies CP/palp/SOB/HA/congestion/fevers/GI or GU c/o. Taking meds as prescribed  Past Medical History:  Diagnosis Date  . Chronic low back pain   . Chronic lymphocytic leukemia ([place])   . DDD (degenerative disc disease), lumbar   . Glaucoma   . Heart murmur   . History of blood transfusion    due to fracture of femur  . Hx of adenomatous colonic polyps    no polyps in 1/08  . Hyperlipidemia   . Insomnia 06/27/2014  . Mild cognitive impairment 01/07/2016  . Osteoporosis   . Skin cancer   . Squamous cell carcinoma [facility])     Past Surgical History:  Procedure Laterality Date  . ABDOMINAL HYSTERECTOMY  2013   at [facility]  . BREAST ENHANCEMENT SURGERY  4010, 2725   with silicone  . CATARACT EXTRACTION     right eye. Also has bilateral eye implants for distance and close up vision.  Marland Kitchen KNEE ARTHROSCOPY  1996   right  . repair of crushed femur  1997  . SKIN CANCER EXCISION Right 12.22.16   Squamous Cell Carcinoma; [place]    Family History  Problem Relation Age of Onset  . Colon cancer Mother     Died from colon caner   . Esophageal cancer Father   . Arthritis Father   . Heart disease Father   . Stroke Maternal Grandmother   . Fibromyalgia Daughter     Social History   Social History  . Marital status: Single    Spouse name: N/A  . Number of children: 1  . Years of education: N/A   Occupational History  .  Retired   Social History Main Topics  . Smoking status: Never Smoker  . Smokeless  tobacco: Never Used  . Alcohol use 4.2 oz/week    7 Glasses of wine per week     Comment: 1 glass wine daily  . Drug use: No  . Sexual activity: Not on file   Other Topics Concern  . 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 acetaminophen (TYLENOL) 500 MG tablet Take 1,000 mg by mouth every morning.    Marland Kitchen aspirin 81 MG tablet Take 81 mg by mouth daily.      . Calcium-Magnesium-Vitamin D (CALCIUM MAGNESIUM PO) Take 1 tablet by mouth daily. Reported on 12/21/2015    . cefdinir (OMNICEF) 300 MG capsule Take 1 capsule (300 mg total) by mouth 2 (two) times daily. Take for 5 days. 10 capsule 0  . Cholecalciferol (VITAMIN D) 2000 units CAPS Take 1 capsule by mouth daily.    Marland Kitchen  Diphenhydramine-APAP, sleep, (TYLENOL PM EXTRA STRENGTH PO) Take 2 tablets by mouth at bedtime.    Marland Kitchen latanoprost (XALATAN) 0.005 % ophthalmic solution Place 1 drop into the right eye daily. 2.5 mL 2  . naproxen sodium (ANAPROX) 220 MG tablet Take 220 mg by mouth daily.    Marland Kitchen senna-docusate (SENOKOT-S) 8.6-50 MG per tablet Take 1 tablet by mouth daily as needed for mild constipation.    Marland Kitchen tolterodine (DETROL LA) 2 MG 24 hr capsule Take 1 capsule (2 mg total) by mouth daily. 30 capsule 3  . Vitamin D, Ergocalciferol, (DRISDOL) 50000 units CAPS capsule Take 1 capsule (50,000 Units total) by mouth every 7 (seven) days. 12 capsule 0   No facility-administered medications prior to visit.     No Known Allergies  Review of Systems  Constitutional: Negative for fever and malaise/fatigue.  HENT: Negative for congestion.   Eyes: Negative for blurred vision.  Respiratory: Negative for shortness of breath.   Cardiovascular: Negative for chest pain,  palpitations and leg swelling.  Gastrointestinal: Negative for abdominal pain, blood in stool and nausea.  Genitourinary: Negative for dysuria and frequency.  Musculoskeletal: Negative for falls.  Skin: Negative for rash.  Neurological: Negative for dizziness, loss of consciousness and headaches.  Endo/Heme/Allergies: Negative for environmental allergies.  Psychiatric/Behavioral: Negative for depression. The patient is not nervous/anxious.        Objective:    Physical Exam  Constitutional: She is oriented to person, place, and time. She appears well-developed and well-nourished. No distress.  HENT:  Head: Normocephalic and atraumatic.  Nose: Nose normal.  Eyes: Right eye exhibits no discharge. Left eye exhibits no discharge.  Neck: Normal range of motion. Neck supple.  Cardiovascular: Normal rate and regular rhythm.   Pulmonary/Chest: Effort normal and breath sounds normal.  Abdominal: Soft. Bowel sounds are normal. There is no tenderness.  Musculoskeletal: She exhibits no edema.  Neurological: She is alert and oriented to person, place, and time.  Skin: Skin is warm and dry.  Psychiatric: She has a normal mood and affect.  Nursing note and vitals reviewed.   BP 120/82 (BP Location: Right Arm, Patient Position: Sitting, Cuff Size: Normal)   Pulse 76   Temp 98 F (36.7 C) (Oral)   Ht _0  (1.575 m)   Wt 126 lb (57.2 kg)   SpO2 96%   BMI 23.05 kg/m  Wt Readings from Last 3 Encounters:  04/26/16 126 lb (57.2 kg)  12/21/15 127 lb 6 oz (57.8 kg)  11/13/15 123 lb 6.4 oz (56 kg)     Lab Results  Component Value Date   WBC 13.1 (H) 06/28/2015   HGB 12.9 06/28/2015   HCT 38.5 06/28/2015   PLT 274 06/28/2015   GLUCOSE 105 (H) 12/19/2015   ALT 11 12/19/2015   AST 16 12/19/2015   NA 138 12/19/2015   K 3.9 12/19/2015   CL 103 12/19/2015   CREATININE 0.88 12/19/2015   BUN 32 (H) 12/19/2015   CO2 29 12/19/2015   TSH 1.17 12/19/2015   HGBA1C 5.9 12/19/2015    Lab  Results  Component Value Date   TSH 1.17 12/19/2015   Lab Results  Component Value Date   WBC 13.1 (H) 06/28/2015   HGB 12.9 06/28/2015   HCT 38.5 06/28/2015   MCV 93.0 06/28/2015   PLT 274 06/28/2015   Lab Results  Component Value Date   NA 138 12/19/2015   K 3.9 12/19/2015   CHLORIDE 104 06/28/2015   CO2 29  12/19/2015   GLUCOSE 105 (H) 12/19/2015   BUN 32 (H) 12/19/2015   CREATININE 0.88 12/19/2015   BILITOT 0.6 12/19/2015   ALKPHOS 49 12/19/2015   AST 16 12/19/2015   ALT 11 12/19/2015   PROT 6.7 12/19/2015   ALBUMIN 4.1 12/19/2015   CALCIUM 9.2 12/19/2015   ANIONGAP 8 06/28/2015   EGFR 54 (L) 06/28/2015   GFR 64.32 12/19/2015   No results found for: CHOL No results found for: HDL No results found for: LDLCALC No results found for: TRIG No results found for: CHOLHDL Lab Results  Component Value Date   HGBA1C 5.9 12/19/2015       Assessment & Plan:   Problem List Items Addressed This Visit    GLAUCOMA    Seen in urgent care with concerns over yellowing in eyes. Work up for hepatitis and liver disease was reviewed with patient since they had not heard yet. No concerns were identified and exam was unremarkable today. Reassurance was given.      Essential hypertension    Well controlled, no changes to meds. Encouraged heart healthy diet such as the DASH diet and exercise as tolerated.       Hyperglycemia    minimize simple carbs. Increase exercise as tolerated.        Other Visit Diagnoses   None.     I am having Ms. Bensman maintain her aspirin, Calcium-Magnesium-Vitamin D (CALCIUM MAGNESIUM PO), naproxen sodium, latanoprost, senna-docusate, acetaminophen, (Diphenhydramine-APAP, sleep, (TYLENOL PM EXTRA STRENGTH PO)), Vitamin D (Ergocalciferol), Vitamin D, tolterodine, and cefdinir.  No orders of the defined types were placed in this encounter.    Penni Homans, MD

## 2016-05-05 NOTE — Assessment & Plan Note (Signed)
Well controlled, no changes to meds. Encouraged heart healthy diet such as the DASH diet and exercise as tolerated.  °

## 2016-05-05 NOTE — Assessment & Plan Note (Signed)
minimize simple carbs. Increase exercise as tolerated.  

## 2016-05-27 ENCOUNTER — Other Ambulatory Visit: Payer: Self-pay | Admitting: Family Medicine

## 2016-05-27 MED ORDER — TOLTERODINE TARTRATE ER 2 MG PO CP24: 2.0000 mg | ORAL_CAPSULE | Freq: Every day | ORAL | 3 refills | Status: DC

## 2016-05-31 ENCOUNTER — Other Ambulatory Visit: Payer: Self-pay | Admitting: Family Medicine

## 2016-05-31 MED ORDER — TOLTERODINE TARTRATE ER 2 MG PO CP24: 2.0000 mg | ORAL_CAPSULE | Freq: Every day | ORAL | 6 refills | Status: DC

## 2016-06-27 ENCOUNTER — Ambulatory Visit: Payer: Medicare Other | Admitting: Hematology and Oncology

## 2016-06-27 ENCOUNTER — Other Ambulatory Visit: Payer: Medicare Other

## 2016-07-10 DIAGNOSIS — L821 Other seborrheic keratosis: Secondary | ICD-10-CM | POA: Diagnosis not present

## 2016-07-10 DIAGNOSIS — Z23 Encounter for immunization: Secondary | ICD-10-CM | POA: Diagnosis not present

## 2016-07-10 DIAGNOSIS — Z85828 Personal history of other malignant neoplasm of skin: Secondary | ICD-10-CM | POA: Diagnosis not present

## 2016-07-10 DIAGNOSIS — D1801 Hemangioma of skin and subcutaneous tissue: Secondary | ICD-10-CM | POA: Diagnosis not present

## 2016-07-10 DIAGNOSIS — L570 Actinic keratosis: Secondary | ICD-10-CM | POA: Diagnosis not present

## 2016-07-10 DIAGNOSIS — D225 Melanocytic nevi of trunk: Secondary | ICD-10-CM | POA: Diagnosis not present

## 2016-07-10 DIAGNOSIS — L814 Other melanin hyperpigmentation: Secondary | ICD-10-CM | POA: Diagnosis not present

## 2016-07-11 DIAGNOSIS — Z1231 Encounter for screening mammogram for malignant neoplasm of breast: Secondary | ICD-10-CM | POA: Diagnosis not present

## 2016-07-11 LAB — HM MAMMOGRAPHY

## 2016-07-16 ENCOUNTER — Telehealth: Payer: Self-pay | Admitting: Family Medicine

## 2016-07-16 ENCOUNTER — Encounter: Payer: Self-pay | Admitting: Family Medicine

## 2016-07-16 DIAGNOSIS — N390 Urinary tract infection, site not specified: Secondary | ICD-10-CM | POA: Diagnosis not present

## 2016-07-16 NOTE — Telephone Encounter (Signed)
Patient's daughter Augustin Coupe Rhina Brackett) is calling to see if Dr. Charlett Blake can get an order for her to go and get a urine sample done at [facility]? She would like her to get in today because she thinks she may have UTI. The clinic is open from 1-3 pm. She would also like to know if Dr. Charlett Blake could write a standing order for that. Please advise.   Caller: Melina Modena Patient relation: daughter  Phone: [phone]

## 2016-07-16 NOTE — Telephone Encounter (Signed)
OK to order UA with culture for UTI, not sure if Alison Hammond will accept a standing order? If they will ask how we order that.

## 2016-07-16 NOTE — Telephone Encounter (Signed)
They will take a standing order. Can fax to [facility] at [phone]. Wrote order on prescription/copied/faxed.

## 2016-07-18 NOTE — Telephone Encounter (Signed)
Daughter informed urine test negative and will await culture results.

## 2016-07-22 NOTE — Telephone Encounter (Signed)
Called the daughter informed of final results as negative.

## 2016-07-29 ENCOUNTER — Telehealth: Payer: Self-pay | Admitting: Family Medicine

## 2016-07-29 NOTE — Telephone Encounter (Signed)
Caller name: Melina Modena Relationship to patient: daughter Can be reached: [phone]  Reason for call: Please call regarding incontinence issues of pt.

## 2016-07-29 NOTE — Telephone Encounter (Signed)
Called the daughter to get clarification on message, left message to call back.

## 2016-07-29 NOTE — Telephone Encounter (Signed)
Incontinence has worsened.  Daughter thinks may need more help with her care and is scheduled appt. For Friday 08/02/16. They will discuss her medications and get advise from  PCP what to do with her.

## 2016-07-31 ENCOUNTER — Telehealth: Payer: Self-pay | Admitting: Family Medicine

## 2016-07-31 NOTE — Telephone Encounter (Signed)
Caller name: Jeani Hawking Relationship to patient: Daughter Can be reached: [phone]  Pharmacy:  Reason for call: Patient's daughter request a call back prior to appointment on Friday.

## 2016-08-01 NOTE — Telephone Encounter (Signed)
Spoke to the daughter and the patient/daughter are coming in together tomorrow to discuss incontinence issues.  The daughter needs help communicating/encouraging the patient that she may need more help such as a CNA to help with showering/getting to the bathroom.  Also the daughter thinks a porta potty would be helpful.  Needs help with dressing/shoes smell at this time as she has urinated in them. So, this being said the patient does not want to listen to the daughter, but the patient is needing additional help that [facility] can supply.

## 2016-08-01 NOTE — Telephone Encounter (Signed)
Returning call.

## 2016-08-01 NOTE — Telephone Encounter (Signed)
Called left message to call back 

## 2016-08-02 ENCOUNTER — Ambulatory Visit (INDEPENDENT_AMBULATORY_CARE_PROVIDER_SITE_OTHER): Payer: Medicare Other | Admitting: Family Medicine

## 2016-08-02 DIAGNOSIS — R32 Unspecified urinary incontinence: Secondary | ICD-10-CM | POA: Diagnosis not present

## 2016-08-02 DIAGNOSIS — R739 Hyperglycemia, unspecified: Secondary | ICD-10-CM

## 2016-08-02 DIAGNOSIS — E559 Vitamin D deficiency, unspecified: Secondary | ICD-10-CM | POA: Diagnosis not present

## 2016-08-02 DIAGNOSIS — I10 Essential (primary) hypertension: Secondary | ICD-10-CM

## 2016-08-02 DIAGNOSIS — J209 Acute bronchitis, unspecified: Secondary | ICD-10-CM

## 2016-08-02 MED ORDER — TOLTERODINE TARTRATE ER 4 MG PO CP24: 4.0000 mg | ORAL_CAPSULE | Freq: Every day | ORAL | 2 refills | Status: DC

## 2016-08-02 NOTE — Patient Instructions (Addendum)
Tylenol PM 1/2 tab=12.5 mg of Benadryl or Diphenhydramine Consider dropping to 6.25 mg and if does OK can try and stop  Elderberry, vitamin C and zinc  Urinary Incontinence Urinary incontinence is the involuntary loss of urine from your bladder. CAUSES  There are many causes of urinary incontinence. They include:  Medicines.  Infections.  Prostatic enlargement, leading to overflow of urine from your bladder.  Surgery.  Neurological diseases.  Emotional factors. SIGNS AND SYMPTOMS Urinary Incontinence can be divided into four types: 1. Urge incontinence. Urge incontinence is the involuntary loss of urine before you have the opportunity to go to the bathroom. There is a sudden urge to void but not enough time to reach a bathroom. 2. Stress incontinence. Stress incontinence is the sudden loss of urine with any activity that forces urine to pass. It is commonly caused by anatomical changes to the pelvis and sphincter areas of your body. 3. Overflow incontinence. Overflow incontinence is the loss of urine from an obstructed opening to your bladder. This results in a backup of urine and a resultant buildup of pressure within the bladder. When the pressure within the bladder exceeds the closing pressure of the sphincter, the urine overflows, which causes incontinence, similar to water overflowing a dam. 4. Total incontinence. Total incontinence is the loss of urine as a result of the inability to store urine within your bladder. DIAGNOSIS  Evaluating the cause of incontinence may require:  A thorough and complete medical and obstetric history.  A complete physical exam.  Laboratory tests such as a urine culture and sensitivities. When additional tests are indicated, they can include:  An ultrasound exam.  Kidney and bladder X-rays.  Cystoscopy. This is an exam of the bladder using a narrow scope.  Urodynamic testing to test the nerve function to the bladder and sphincter  areas. TREATMENT  Treatment for urinary incontinence depends on the cause:  For urge incontinence caused by a bacterial infection, antibiotics will be prescribed. If the urge incontinence is related to medicines you take, your health care provider may have you change the medicine.  For stress incontinence, surgery to re-establish anatomical support to the bladder or sphincter, or both, will often correct the condition.  For overflow incontinence caused by an enlarged prostate, an operation to open the channel through the enlarged prostate will allow the flow of urine out of the bladder. In women with fibroids, a hysterectomy may be recommended.  For total incontinence, surgery on your urinary sphincter may help. An artificial urinary sphincter (an inflatable cuff placed around the urethra) may be required. In women who have developed a hole-like passage between their bladder and vagina (vesicovaginal fistula), surgery to close the fistula often is required. HOME CARE INSTRUCTIONS  Normal daily hygiene and the use of pads or adult diapers that are changed regularly will help prevent odors and skin damage.  Avoid caffeine. It can overstimulate your bladder.  Use the bathroom regularly. Try about every 2-3 hours to go to the bathroom, even if you do not feel the need to do so. Take time to empty your bladder completely. After urinating, wait a minute. Then try to urinate again.  For causes involving nerve dysfunction, keep a log of the medicines you take and a journal of the times you go to the bathroom. SEEK MEDICAL CARE IF:  You experience worsening of pain instead of improvement in pain after your procedure.  Your incontinence becomes worse instead of better. SEE IMMEDIATE MEDICAL CARE IF:  You experience fever or shaking chills.  You are unable to pass your urine.  You have redness spreading into your groin or down into your thighs. MAKE SURE YOU:  ?Understand these instructions.    Will watch your condition.  Will get help right away if you are not doing well or get worse. This information is not intended to replace advice given to you by your health care provider. Make sure you discuss any questions you have with your health care provider. Document Released: 10/10/2004 Document Revised: 09/23/2014 Document Reviewed: 02/09/2013 Elsevier Interactive Patient Education  2017 [employer].

## 2016-08-02 NOTE — Progress Notes (Signed)
Pre visit review using our clinic review tool, if applicable. No additional management support is needed unless otherwise documented below in the visit note. 

## 2016-08-18 NOTE — Assessment & Plan Note (Signed)
minimize simple carbs. Increase exercise as tolerated.  

## 2016-08-18 NOTE — Assessment & Plan Note (Signed)
Encouraged daily ongoing supplements

## 2016-08-18 NOTE — Assessment & Plan Note (Signed)
Encouraged increased rest and hydration, add probiotics, zinc such as Coldeze or Xicam. Treat fevers as needed. Start Mucinex bid and Omnicef bid

## 2016-08-18 NOTE — Assessment & Plan Note (Signed)
Well controlled, no changes to meds. Encouraged heart healthy diet such as the DASH diet and exercise as tolerated.  °

## 2016-08-18 NOTE — Progress Notes (Signed)
Patient ID: Alison Hammond, female   DOB: January 03, 1925, 82 y.o.   MRN: [id number]   Subjective:    Patient ID: Alison Hammond, female    DOB: 10/29/1924, 37 y.o.   MRN: [id number]  No chief complaint on file.   HPI Patient is in today for follow up . She is frustrated with her persistent urinary incontinence although she doesendorse it has improved some. Denies hematuria or dysuria. No abdominal pain or other acute complaints. No recent hospitalization. She is noting increased congestion in head and chest with PND and cough. Endorses malaise and fatigue but denies any fevers. Has a headache and neck pain at times as well. No visual changes or neurologic complaints.   Past Medical History:  Diagnosis Date  . Chronic low back pain   . Chronic lymphocytic leukemia ([place])   . DDD (degenerative disc disease), lumbar   . Glaucoma   . Heart murmur   . History of blood transfusion    due to fracture of femur  . Hx of adenomatous colonic polyps    no polyps in 1/08  . Hyperlipidemia   . Insomnia 06/27/2014  . Mild cognitive impairment 01/07/2016  . Osteoporosis   . Skin cancer   . Squamous cell carcinoma     Past Surgical History:  Procedure Laterality Date  . ABDOMINAL HYSTERECTOMY  2013   at [facility]  . BREAST ENHANCEMENT SURGERY  9628, 3662   with silicone  . CATARACT EXTRACTION     right eye. Also has bilateral eye implants for distance and close up vision.  Marland Kitchen KNEE ARTHROSCOPY  1996   right  . repair of crushed femur  1997  . SKIN CANCER EXCISION Right 12.22.16   Squamous Cell Carcinoma; [place]    Family History  Problem Relation Age of Onset  . Colon cancer Mother     Died from colon caner   . Esophageal cancer Father   . Arthritis Father   . Heart disease Father   . Stroke Maternal Grandmother   . Fibromyalgia Daughter     Social History   Social History  . Marital status: Single    Spouse name: N/A  . Number of children: 1  . Years of education: N/A   Occupational  History  .  Retired   Social History Main Topics  . Smoking status: Never Smoker  . Smokeless tobacco: Never Used  . Alcohol use 4.2 oz/week    7 Glasses of wine per week     Comment: 1 glass wine daily  . Drug use: No  . Sexual activity: Not on file   Other Topics Concern  . 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 acetaminophen (TYLENOL) 500 MG tablet Take 1,000 mg by mouth every morning.    Marland Kitchen aspirin 81 MG tablet Take 81 mg by mouth daily.      . Calcium-Magnesium-Vitamin D (CALCIUM MAGNESIUM PO) Take 1 tablet by mouth daily. Reported on 12/21/2015    . cefdinir (OMNICEF) 300 MG capsule Take 1 capsule (300 mg total) by mouth 2 (two) times daily. Take for 5 days. 10 capsule 0  .  Cholecalciferol (VITAMIN D) 2000 units CAPS Take 1 capsule by mouth daily.    . Diphenhydramine-APAP, sleep, (TYLENOL PM EXTRA STRENGTH PO) Take 2 tablets by mouth at bedtime.    Marland Kitchen latanoprost (XALATAN) 0.005 % ophthalmic solution Place 1 drop into the right eye daily. 2.5 mL 2  . naproxen sodium (ANAPROX) 220 MG tablet Take 220 mg by mouth daily.    Marland Kitchen senna-docusate (SENOKOT-S) 8.6-50 MG per tablet Take 1 tablet by mouth daily as needed for mild constipation.    . Vitamin D, Ergocalciferol, (DRISDOL) 50000 units CAPS capsule Take 1 capsule (50,000 Units total) by mouth every 7 (seven) days. 12 capsule 0  . tolterodine (DETROL LA) 2 MG 24 hr capsule Take 1 capsule (2 mg total) by mouth daily. 30 capsule 6   No facility-administered medications prior to visit.     No Known Allergies  Review of Systems  Constitutional: Positive for malaise/fatigue. Negative for fever.  HENT: Positive for congestion.   Eyes: Negative for  blurred vision.  Respiratory: Positive for cough, sputum production and shortness of breath.   Cardiovascular: Negative for chest pain, palpitations and leg swelling.  Gastrointestinal: Negative for abdominal pain, blood in stool and nausea.  Genitourinary: Positive for frequency and urgency. Negative for dysuria, flank pain and hematuria.  Musculoskeletal: Positive for neck pain. Negative for back pain, falls and myalgias.  Skin: Negative for rash.  Neurological: Positive for headaches. Negative for dizziness and loss of consciousness.  Endo/Heme/Allergies: Negative for environmental allergies.  Psychiatric/Behavioral: Negative for depression. The patient is not nervous/anxious.        Objective:    Physical Exam  Constitutional: She is oriented to person, place, and time. She appears well-developed and well-nourished. No distress.  HENT:  Head: Normocephalic and atraumatic.  Nose: Nose normal.  Eyes: Right eye exhibits no discharge. Left eye exhibits no discharge.  Neck: Normal range of motion. Neck supple.  Cardiovascular: Normal rate and regular rhythm.   No murmur heard. Pulmonary/Chest: Effort normal. She has no wheezes.  Decreased breath sounds b/l bases  Abdominal: Soft. Bowel sounds are normal. There is no tenderness.  Genitourinary: Rectal exam shows guaiac negative stool.  Musculoskeletal: She exhibits no edema.  Neurological: She is alert and oriented to person, place, and time.  Skin: Skin is warm and dry.  Psychiatric: She has a normal mood and affect.  Nursing note and vitals reviewed.   BP 132/72 (BP Location: Left Arm, Patient Position: Sitting, Cuff Size: Normal)   Pulse 90   Temp 98 F (36.7 C) (Oral)   Wt 129 lb (58.5 kg)   SpO2 99%   BMI 23.59 kg/m  Wt Readings from Last 3 Encounters:  08/02/16 129 lb (58.5 kg)  04/26/16 126 lb (57.2 kg)  12/21/15 127 lb 6 oz (57.8 kg)     Lab Results  Component Value Date   WBC 13.1 (H) 06/28/2015   HGB 12.9  06/28/2015   HCT 38.5 06/28/2015   PLT 274 06/28/2015   GLUCOSE 105 (H) 12/19/2015   ALT 11 12/19/2015   AST 16 12/19/2015   NA 138 12/19/2015   K 3.9 12/19/2015   CL 103 12/19/2015   CREATININE 0.88 12/19/2015   BUN 32 (H) 12/19/2015   CO2 29 12/19/2015   TSH 1.17 12/19/2015   HGBA1C 5.9 12/19/2015    Lab Results  Component Value Date   TSH 1.17 12/19/2015   Lab Results  Component Value Date   WBC 13.1 (H) 06/28/2015  HGB 12.9 06/28/2015   HCT 38.5 06/28/2015   MCV 93.0 06/28/2015   PLT 274 06/28/2015   Lab Results  Component Value Date   NA 138 12/19/2015   K 3.9 12/19/2015   CHLORIDE 104 06/28/2015   CO2 29 12/19/2015   GLUCOSE 105 (H) 12/19/2015   BUN 32 (H) 12/19/2015   CREATININE 0.88 12/19/2015   BILITOT 0.6 12/19/2015   ALKPHOS 49 12/19/2015   AST 16 12/19/2015   ALT 11 12/19/2015   PROT 6.7 12/19/2015   ALBUMIN 4.1 12/19/2015   CALCIUM 9.2 12/19/2015   ANIONGAP 8 06/28/2015   EGFR 54 (L) 06/28/2015   GFR 64.32 12/19/2015   No results found for: CHOL No results found for: HDL No results found for: LDLCALC No results found for: TRIG No results found for: CHOLHDL Lab Results  Component Value Date   HGBA1C 5.9 12/19/2015       Assessment & Plan:   Problem List Items Addressed This Visit    Vitamin D deficiency (Chronic)    Encouraged daily ongoing supplements      Acute bronchitis    Encouraged increased rest and hydration, add probiotics, zinc such as Coldeze or Xicam. Treat fevers as needed. Start Mucinex bid and Omnicef bid      Urinary incontinence    Improved some but will use Detrol at 39m and reassess.       Relevant Medications   tolterodine (DETROL LA) 4 MG 24 hr capsule   Essential hypertension    Well controlled, no changes to meds. Encouraged heart healthy diet such as the DASH diet and exercise as tolerated.       Hyperglycemia    minimize simple carbs. Increase exercise as tolerated.          I have  discontinued Ms. Keckler's tolterodine. I am also having her start on tolterodine. Additionally, I am having her maintain her aspirin, Calcium-Magnesium-Vitamin D (CALCIUM MAGNESIUM PO), naproxen sodium, latanoprost, senna-docusate, acetaminophen, (Diphenhydramine-APAP, sleep, (TYLENOL PM EXTRA STRENGTH PO)), Vitamin D (Ergocalciferol), Vitamin D, and cefdinir.  Meds ordered this encounter  Medications  . tolterodine (DETROL LA) 4 MG 24 hr capsule    Sig: Take 1 capsule (4 mg total) by mouth daily.    Dispense:  30 capsule    Refill:  2     BPenni Homans MD

## 2016-08-18 NOTE — Assessment & Plan Note (Signed)
Improved some but will use Detrol at 4mg  and reassess.

## 2016-08-21 ENCOUNTER — Telehealth: Payer: Self-pay | Admitting: *Deleted

## 2016-08-21 NOTE — Telephone Encounter (Signed)
Appt scheduled 08/29/16.

## 2016-08-23 ENCOUNTER — Ambulatory Visit: Payer: Medicare Other | Admitting: *Deleted

## 2016-08-29 ENCOUNTER — Ambulatory Visit: Payer: Medicare Other | Admitting: *Deleted

## 2016-09-03 ENCOUNTER — Telehealth: Payer: Self-pay | Admitting: Family Medicine

## 2016-09-03 NOTE — Telephone Encounter (Signed)
Caller name: Melina Modena Relationship to patient: daughter Can be reached: [phone]  Reason for call: Please sent RX for potty chair for pt. Send to [facility]. Ins will cover with RX. Would like to get on Thursday this week.  Ph: [phone] Fax: [phone]

## 2016-09-03 NOTE — Telephone Encounter (Signed)
Called left message to call back 

## 2016-09-03 NOTE — Telephone Encounter (Signed)
Clarify if she wants an elevated chair for commode or bedside commode then ok to give order

## 2016-09-03 NOTE — Telephone Encounter (Signed)
Spoke to the daughter and they are needing a bedside commode. Once completed will fax to [facility] and they will go to the [place] location to pickup for pt.

## 2016-09-07 ENCOUNTER — Encounter (HOSPITAL_BASED_OUTPATIENT_CLINIC_OR_DEPARTMENT_OTHER): Payer: Self-pay

## 2016-09-07 ENCOUNTER — Emergency Department (HOSPITAL_BASED_OUTPATIENT_CLINIC_OR_DEPARTMENT_OTHER)
Admission: EM | Admit: 2016-09-07 | Discharge: 2016-09-08 | Disposition: A | Discharge status: Home / Self Care | Payer: Medicare Other | Attending: Emergency Medicine | Admitting: Emergency Medicine

## 2016-09-07 DIAGNOSIS — I10 Essential (primary) hypertension: Secondary | ICD-10-CM | POA: Diagnosis not present

## 2016-09-07 DIAGNOSIS — R8290 Unspecified abnormal findings in urine: Secondary | ICD-10-CM | POA: Insufficient documentation

## 2016-09-07 DIAGNOSIS — R4182 Altered mental status, unspecified: Secondary | ICD-10-CM | POA: Diagnosis not present

## 2016-09-07 DIAGNOSIS — Z79899 Other long term (current) drug therapy: Secondary | ICD-10-CM | POA: Diagnosis not present

## 2016-09-07 DIAGNOSIS — R402441 Other coma, without documented Glasgow coma scale score, or with partial score reported, in the field [EMT or ambulance]: Secondary | ICD-10-CM | POA: Diagnosis not present

## 2016-09-07 LAB — URINALYSIS, MICROSCOPIC (REFLEX)

## 2016-09-07 LAB — URINALYSIS, ROUTINE W REFLEX MICROSCOPIC
Bilirubin Urine: NEGATIVE
Glucose, UA: NEGATIVE mg/dL
Ketones, ur: 15 mg/dL — AB
NITRITE: NEGATIVE
PH: 6.5 (ref 5.0–8.0)
Protein, ur: 100 mg/dL — AB
Specific Gravity, Urine: 1.015 (ref 1.005–1.030)

## 2016-09-07 NOTE — ED Triage Notes (Signed)
Pt arrived via [facility]. EMS report pt has periods of intermittent confusion that started today. Pt is normally A/Ox4. EMS report pt had an malodor of urine about pt. Pt is disoriented to time on arrival.

## 2016-09-07 NOTE — ED Provider Notes (Signed)
[facility] Provider Note   CSN: [id number] Arrival date & time: 09/07/16  2246  By signing my name below, I, Jeanell Sparrow, attest that this documentation has been prepared under the direction and in the presence of Merryl Hacker, MD . Electronically Signed: Jeanell Sparrow, Scribe. 09/07/2016. 11:25 PM.  History   Chief Complaint Chief Complaint  Patient presents with  . Altered Mental Status    The history is provided by a relative and the patient. No language interpreter was used.   HPI Comments: Latres Kizewski is a 82 y.o. female who presents to the Emergency Department complaining of AMS that started today. Daughter states that pt has been confused over the past week. She reports pt called her saying she was lost and later on security at her assisted living found her on the floor. She states she has never had an episode of this severity. Daughter reports that she has not eaten all day. No history of dementia. Daughter does report history of urinary tract infections. Reports foul-smelling urine. Patient herself denies any dysuria, back pain, fever, other complaints. Denies cough or chest pain. No noted speech disturbance, focal weakness, numbness.  She lives in an assisted-living facility.    PCP: Penni Homans, MD  Past Medical History:  Diagnosis Date  . Chronic low back pain   . Chronic lymphocytic leukemia ([place])   . DDD (degenerative disc disease), lumbar   . Glaucoma   . Heart murmur   . History of blood transfusion    due to fracture of femur  . Hx of adenomatous colonic polyps    no polyps in 1/08  . Hyperlipidemia   . Insomnia 06/27/2014  . Mild cognitive impairment 01/07/2016  . Osteoporosis   . Skin cancer   . Squamous cell carcinoma     Patient Active Problem List   Diagnosis Date Noted  . Mild cognitive impairment 01/07/2016  . Hyperglycemia 11/26/2015  . Skin cancer, upper extremity 08/22/2015  . Essential hypertension 06/28/2015  . Low  back pain 02/14/2015  . Urinary incontinence 11/14/2014  . Acute bronchitis 08/05/2014  . Insomnia 06/27/2014  . Dandruff 04/21/2013  . Allergic rhinitis 12/27/2012  . Vitamin D deficiency 02/27/2009  . GLAUCOMA 02/27/2009  . SCOLIOSIS 02/27/2009  . Chronic lymphocytic leukemia ([place]) 02/09/2009  . DEGENERATIVE DISC DISEASE, LUMBAR SPINE 02/09/2009  . SENILE OSTEOPOROSIS 02/09/2009    Past Surgical History:  Procedure Laterality Date  . ABDOMINAL HYSTERECTOMY  2013   at [facility]  . BREAST ENHANCEMENT SURGERY  [id number], [id number]   with silicone  . CATARACT EXTRACTION     right eye. Also has bilateral eye implants for distance and close up vision.  Marland Kitchen KNEE ARTHROSCOPY  1996   right  . repair of crushed femur  1997  . SKIN CANCER EXCISION Right 12.22.16   Squamous Cell Carcinoma; [place]    OB History    Gravida Para Term Preterm AB Living   1 1           SAB TAB Ectopic Multiple Live Births                   Home Medications    Prior to Admission medications   Medication Sig Start Date End Date Taking? Authorizing Provider  acetaminophen (TYLENOL) 500 MG tablet Take 1,000 mg by mouth every morning.    Historical Provider, MD  aspirin 81 MG tablet Take 81 mg by mouth daily.      Historical  Provider, MD  Calcium-Magnesium-Vitamin D (CALCIUM MAGNESIUM PO) Take 1 tablet by mouth daily. Reported on 12/21/2015    Historical Provider, MD  cefdinir (OMNICEF) 300 MG capsule Take 1 capsule (300 mg total) by mouth 2 (two) times daily. Take for 5 days. 02/27/16   Mosie Lukes, MD  cephALEXin (KEFLEX) 500 MG capsule Take 1 capsule (500 mg total) by mouth 3 (three) times daily. 09/08/16   Merryl Hacker, MD  Cholecalciferol (VITAMIN D) 2000 units CAPS Take 1 capsule by mouth daily.    Historical Provider, MD  Diphenhydramine-APAP, sleep, (TYLENOL PM EXTRA STRENGTH PO) Take 2 tablets by mouth at bedtime.    Historical Provider, MD  latanoprost (XALATAN) 0.005 % ophthalmic solution Place 1  drop into the right eye daily. 05/16/14   Debbrah Alar, NP  naproxen sodium (ANAPROX) 220 MG tablet Take 220 mg by mouth daily.    Historical Provider, MD  senna-docusate (SENOKOT-S) 8.6-50 MG per tablet Take 1 tablet by mouth daily as needed for mild constipation.    Historical Provider, MD  tolterodine (DETROL LA) 4 MG 24 hr capsule Take 1 capsule (4 mg total) by mouth daily. 08/02/16   Mosie Lukes, MD  Vitamin D, Ergocalciferol, (DRISDOL) 50000 units CAPS capsule Take 1 capsule (50,000 Units total) by mouth every 7 (seven) days. 12/19/15   Mosie Lukes, MD    Family History Family History  Problem Relation Age of Onset  . Colon cancer Mother     Died from colon caner   . Esophageal cancer Father   . Arthritis Father   . Heart disease Father   . Stroke Maternal Grandmother   . Fibromyalgia Daughter     Social History Social History  Substance Use Topics  . Smoking status: Never Smoker  . Smokeless tobacco: Never Used  . Alcohol use 4.2 oz/week    7 Glasses of wine per week     Comment: 1 glass wine daily     Allergies   Patient has no known allergies.   Review of Systems Review of Systems  Constitutional: Negative for fever.  Respiratory: Negative for cough and shortness of breath.   Cardiovascular: Negative for chest pain.  Gastrointestinal: Negative for abdominal pain, nausea and vomiting.  Genitourinary: Negative for dysuria.       Foul smelling urine  Musculoskeletal: Negative for back pain.  Psychiatric/Behavioral: Positive for confusion.  All other systems reviewed and are negative.    Physical Exam Updated Vital Signs BP 146/79 (BP Location: Right Arm)   Pulse 82   Temp 97.4 F (36.3 C) (Oral)   Resp 17   Ht 5\' 5"  (1.651 m)   Wt 125 lb (56.7 kg)   SpO2 100%   BMI 20.80 kg/m   Physical Exam  Constitutional: She is oriented to person, place, and time. No distress.  Elderly, nontoxic, no acute distress  HENT:  Head: Normocephalic and  atraumatic.  Eyes: Pupils are equal, round, and reactive to light.  Cardiovascular: Normal rate, regular rhythm and normal heart sounds.   Pulmonary/Chest: Effort normal and breath sounds normal. No respiratory distress. She has no wheezes.  Abdominal: Soft. Bowel sounds are normal. There is no tenderness. There is no guarding.  Musculoskeletal: She exhibits edema. She exhibits no deformity.  Trace bilateral lower extremity edema  Neurological: She is alert and oriented to person, place, and time.  Fluent speech, Cranial nerves II through XII intact, 5 out of 5 strength in all 4 extremities, no  dysmetria to finger-nose-finger  Skin: Skin is warm and dry.  Psychiatric: She has a normal mood and affect.  Nursing note and vitals reviewed.    ED Treatments / Results  DIAGNOSTIC STUDIES: Oxygen Saturation is 100% on RA, normal by my interpretation.    COORDINATION OF CARE: 11:29 PM- Pt advised of plan for treatment and pt agrees.  Labs (all labs ordered are listed, but only abnormal results are displayed) Labs Reviewed  URINALYSIS, ROUTINE W REFLEX MICROSCOPIC - Abnormal; Notable for the following:       Result Value   APPearance CLOUDY (*)    Hgb urine dipstick SMALL (*)    Ketones, ur 15 (*)    Protein, ur 100 (*)    Leukocytes, UA TRACE (*)    All other components within normal limits  CBC WITH DIFFERENTIAL/PLATELET - Abnormal; Notable for the following:    WBC 15.9 (*)    Neutro Abs 7.9 (*)    Lymphs Abs 6.7 (*)    Monocytes Absolute 1.3 (*)    All other components within normal limits  BASIC METABOLIC PANEL - Abnormal; Notable for the following:    Glucose, Bld 121 (*)    BUN 29 (*)    GFR calc non Af Amer 60 (*)    All other components within normal limits  URINALYSIS, MICROSCOPIC (REFLEX) - Abnormal; Notable for the following:    Bacteria, UA FEW (*)    Squamous Epithelial / LPF 0-5 (*)    All other components within normal limits  URINE CULTURE  PATHOLOGIST SMEAR  REVIEW    EKG  EKG Interpretation None       Radiology Ct Head Wo Contrast  Result Date: 09/08/2016 CLINICAL DATA:  82 year old female with altered mental status. EXAM: CT HEAD WITHOUT CONTRAST TECHNIQUE: Contiguous axial images were obtained from the base of the skull through the vertex without intravenous contrast. COMPARISON:  None. FINDINGS: Brain: There is moderate age-related atrophy and chronic microvascular ischemic changes. There is no acute intracranial hemorrhage. No mass effect or midline shift noted. No extra-axial fluid collections. Vascular: No hyperdense vessel or unexpected calcification. Skull: Normal. Negative for fracture or focal lesion. Sinuses/Orbits: No acute finding. Other: None IMPRESSION: No acute intracranial hemorrhage. Age-related atrophy and chronic microvascular ischemic disease. If symptoms persist and there are no contraindications, MRI may provide better evaluation if clinically indicated. Electronically Signed   By: Anner Crete M.D.   On: 09/08/2016 01:48    Procedures Procedures (including critical care time)  Medications Ordered in ED Medications  sodium chloride 0.9 % bolus 1,000 mL (0 mLs Intravenous Stopped 09/08/16 0234)  cefTRIAXone (ROCEPHIN) 1 g in dextrose 5 % 50 mL IVPB (0 g Intravenous Stopped 09/08/16 0234)     Initial Impression / Assessment and Plan / ED Course  I have reviewed the triage vital signs and the nursing notes.  Pertinent labs & imaging results that were available during my care of the patient were reviewed by me and considered in my medical decision making (see chart for details).  Clinical Course    Patient presents with altered mental status per the patient's daughter. She is currently awake, alert, and oriented. She is able to provide some history but does seem confused on some things. From a neurologic standpoint, no obvious focal neuro deficits.  She's nontoxic. Afebrile.   Patient reports that she sat on the  floor. Unclear whether there was a fall. She has no obvious trauma. Labwork obtained. Mild leukocytosis with  some atypical lymphocytes. Urinalysis did not show obvious urinary tract infection.  Urine culture pending.  She denies any pulmonary complaints and has a normal pulmonary exam. Will defer x-ray.  Metabolites reassuring. CT obtained given difficulty to corroberate history of no fall. CT negative.  Patient is ambulatory with her walker. Independent with minimal assistance. Discussed with the daughter treating empirically for UTI given symptoms. Daughter plans to stay with her mother tonight. If she worsens or daughter notices any focal symptoms I have encouraged her to take her to [facility] for further evaluation can be undertaken.  After history, exam, and medical workup I feel the patient has been appropriately medically screened and is safe for discharge home. Pertinent diagnoses were discussed with the patient. Patient was given return precautions.  Final Clinical Impressions(s) / ED Diagnoses   Final diagnoses:  Altered mental status, unspecified altered mental status type  Abnormal urine odor    New Prescriptions New Prescriptions   CEPHALEXIN (KEFLEX) 500 MG CAPSULE    Take 1 capsule (500 mg total) by mouth 3 (three) times daily.   I personally performed the services described in this documentation, which was scribed in my presence. The recorded information has been reviewed and is accurate.     Merryl Hacker, MD 09/08/16 [id number]

## 2016-09-08 ENCOUNTER — Emergency Department (HOSPITAL_BASED_OUTPATIENT_CLINIC_OR_DEPARTMENT_OTHER): Payer: Medicare Other

## 2016-09-08 DIAGNOSIS — R4182 Altered mental status, unspecified: Secondary | ICD-10-CM | POA: Diagnosis not present

## 2016-09-08 LAB — CBC WITH DIFFERENTIAL/PLATELET
BASOS PCT: 0 %
Basophils Absolute: 0 10*3/uL (ref 0.0–0.1)
EOS ABS: 0 10*3/uL (ref 0.0–0.7)
EOS PCT: 0 %
HCT: 36.9 % (ref 36.0–46.0)
HEMOGLOBIN: 12.7 g/dL (ref 12.0–15.0)
LYMPHS PCT: 42 %
Lymphs Abs: 6.7 10*3/uL — ABNORMAL HIGH (ref 0.7–4.0)
MCH: 30.6 pg (ref 26.0–34.0)
MCHC: 34.4 g/dL (ref 30.0–36.0)
MCV: 88.9 fL (ref 78.0–100.0)
MONO ABS: 1.3 10*3/uL — AB (ref 0.1–1.0)
MONOS PCT: 8 %
NEUTROS PCT: 50 %
Neutro Abs: 7.9 10*3/uL — ABNORMAL HIGH (ref 1.7–7.7)
PLATELETS: 257 10*3/uL (ref 150–400)
RBC: 4.15 MIL/uL (ref 3.87–5.11)
RDW: 14.4 % (ref 11.5–15.5)
WBC: 15.9 10*3/uL — AB (ref 4.0–10.5)

## 2016-09-08 LAB — BASIC METABOLIC PANEL
Anion gap: 10 (ref 5–15)
BUN: 29 mg/dL — AB (ref 6–20)
CHLORIDE: 102 mmol/L (ref 101–111)
CO2: 25 mmol/L (ref 22–32)
CREATININE: 0.84 mg/dL (ref 0.44–1.00)
Calcium: 9 mg/dL (ref 8.9–10.3)
GFR calc Af Amer: >60 mL/min (ref >60)
GFR calc non Af Amer: 60 mL/min — ABNORMAL LOW (ref >60)
Glucose, Bld: 121 mg/dL — ABNORMAL HIGH (ref 65–99)
Potassium: 3.7 mmol/L (ref 3.5–5.1)
Sodium: 137 mmol/L (ref 135–145)

## 2016-09-08 MED ORDER — DEXTROSE 5 % IV SOLN
1.0000 g | Freq: Once | INTRAVENOUS | Status: AC
  Administered 2016-09-08: 1 g via INTRAVENOUS
  Filled 2016-09-08: qty 10

## 2016-09-08 MED ORDER — SODIUM CHLORIDE 0.9 % IV BOLUS (SEPSIS)
1000.0000 mL | Freq: Once | INTRAVENOUS | Status: AC
  Administered 2016-09-08: 1000 mL via INTRAVENOUS

## 2016-09-08 MED ORDER — CEPHALEXIN 500 MG PO CAPS: 500.0000 mg | ORAL_CAPSULE | Freq: Three times a day (TID) | ORAL | 0 refills | Status: DC

## 2016-09-08 NOTE — ED Notes (Signed)
Ambulated with EMT and MD in hall, daughter present.

## 2016-09-08 NOTE — ED Notes (Signed)
Ambulated with EMT in hall

## 2016-09-08 NOTE — ED Notes (Signed)
Dr. Horton at BS.  

## 2016-09-08 NOTE — ED Notes (Signed)
Pt given something to eat and drink. Daughter at bedside assisting.

## 2016-09-08 NOTE — ED Notes (Signed)
Patient transported to CT 

## 2016-09-08 NOTE — Discharge Instructions (Signed)
You were seen today for confusion. Your workup is largely reassuring. Given urinary symptoms, you will be treated for UTI. Urine culture is pending.  You did have elevated white blood cell count with abnormal lymphocytes. A pathologist will review the smear. If you develop new or worsening symptoms she needs to be reevaluated immediately including, weakness, speech difficulty, difficulty ambulating or any new or worsening symptoms.

## 2016-09-09 LAB — URINE CULTURE

## 2016-09-10 ENCOUNTER — Telehealth: Payer: Self-pay | Admitting: Family Medicine

## 2016-09-10 LAB — PATHOLOGIST SMEAR REVIEW

## 2016-09-10 NOTE — Telephone Encounter (Signed)
Caller name: Jeani Hawking Relationship to patient: Daughter Can be reached: [phone] Pharmacy:  Reason for call: Patients daughter request call back with results from labs done on 12/23 in ER.

## 2016-09-12 ENCOUNTER — Encounter: Payer: Self-pay | Admitting: Family Medicine

## 2016-09-12 ENCOUNTER — Ambulatory Visit (INDEPENDENT_AMBULATORY_CARE_PROVIDER_SITE_OTHER): Payer: Medicare Other | Admitting: Family Medicine

## 2016-09-12 VITALS — BP 108/62 | HR 90 | Temp 97.0°F | Wt 127.0 lb

## 2016-09-12 DIAGNOSIS — I10 Essential (primary) hypertension: Secondary | ICD-10-CM

## 2016-09-12 DIAGNOSIS — R739 Hyperglycemia, unspecified: Secondary | ICD-10-CM | POA: Diagnosis not present

## 2016-09-12 DIAGNOSIS — G3184 Mild cognitive impairment, so stated: Secondary | ICD-10-CM

## 2016-09-12 DIAGNOSIS — D72829 Elevated white blood cell count, unspecified: Secondary | ICD-10-CM

## 2016-09-12 DIAGNOSIS — R32 Unspecified urinary incontinence: Secondary | ICD-10-CM

## 2016-09-12 DIAGNOSIS — M810 Age-related osteoporosis without current pathological fracture: Secondary | ICD-10-CM

## 2016-09-12 HISTORY — DX: Elevated white blood cell count, unspecified: D72.829

## 2016-09-12 LAB — COMPREHENSIVE METABOLIC PANEL
ALT: 20 U/L (ref 0–35)
AST: 22 U/L (ref 0–37)
Albumin: 4 g/dL (ref 3.5–5.2)
Alkaline Phosphatase: 52 U/L (ref 39–117)
BUN: 21 mg/dL (ref 6–23)
CALCIUM: 9 mg/dL (ref 8.4–10.5)
CHLORIDE: 101 meq/L (ref 96–112)
CO2: 33 meq/L — AB (ref 19–32)
CREATININE: 0.86 mg/dL (ref 0.40–1.20)
GFR: 65.94 mL/min (ref >60.00)
Glucose, Bld: 105 mg/dL — ABNORMAL HIGH (ref 70–99)
Potassium: 4.6 mEq/L (ref 3.5–5.1)
SODIUM: 138 meq/L (ref 135–145)
Total Bilirubin: 0.5 mg/dL (ref 0.2–1.2)
Total Protein: 6.5 g/dL (ref 6.0–8.3)

## 2016-09-12 LAB — CBC
HCT: 37.7 % (ref 36.0–46.0)
Hemoglobin: 12.8 g/dL (ref 12.0–15.0)
MCHC: 33.9 g/dL (ref 30.0–36.0)
MCV: 90.8 fl (ref 78.0–100.0)
Platelets: 274 10*3/uL (ref 150.0–400.0)
RBC: 4.15 Mil/uL (ref 3.87–5.11)
RDW: 14.7 % (ref 11.5–15.5)
WBC: 11.6 10*3/uL — ABNORMAL HIGH (ref 4.0–10.5)

## 2016-09-12 LAB — POCT URINALYSIS DIPSTICK
BILIRUBIN UA: NEGATIVE
Glucose, UA: NEGATIVE
Ketones, UA: NEGATIVE
Leukocytes, UA: NEGATIVE
NITRITE UA: NEGATIVE
PH UA: 6
Spec Grav, UA: 1.025
UROBILINOGEN UA: NEGATIVE

## 2016-09-12 MED ORDER — TOLTERODINE TARTRATE ER 4 MG PO CP24: 2.0000 mg | ORAL_CAPSULE | Freq: Every day | ORAL | Status: DC

## 2016-09-12 NOTE — Telephone Encounter (Signed)
Patient does have an appt. Today and will discuss.

## 2016-09-12 NOTE — Assessment & Plan Note (Signed)
Increased Detrol did not help so will drop back to 2 mg daily for now and consider change in med vs urology referral at next visit

## 2016-09-12 NOTE — Assessment & Plan Note (Signed)
Well controlled, no changes to meds. Encouraged heart healthy diet such as the DASH diet and exercise as tolerated.  °

## 2016-09-12 NOTE — Progress Notes (Signed)
Pre visit review using our clinic review tool, if applicable. No additional management support is needed unless otherwise documented below in the visit note. 

## 2016-09-12 NOTE — Telephone Encounter (Signed)
Her chart says she has an appt today? She had an elevated WBC, nothing else striking in ER blood work. Need to repeat CBC with diff

## 2016-09-13 LAB — URINE CULTURE: ORGANISM ID, BACTERIA: NO GROWTH

## 2016-09-18 DIAGNOSIS — M6281 Muscle weakness (generalized): Secondary | ICD-10-CM | POA: Diagnosis not present

## 2016-09-18 DIAGNOSIS — R2689 Other abnormalities of gait and mobility: Secondary | ICD-10-CM | POA: Diagnosis not present

## 2016-09-18 DIAGNOSIS — R41841 Cognitive communication deficit: Secondary | ICD-10-CM | POA: Diagnosis not present

## 2016-09-18 NOTE — Assessment & Plan Note (Signed)
minimize simple carbs. Increase exercise as tolerated.  

## 2016-09-18 NOTE — Assessment & Plan Note (Signed)
Encouraged to get adequate exercise, calcium and vitamin d intake 

## 2016-09-18 NOTE — Progress Notes (Signed)
Patient ID: Alison Hammond, female   DOB: [birth year], 83 y.o.   MRN: [id number]   Subjective:    Patient ID: Alison Hammond, female    DOB: 1924/10/10, 40 y.o.   MRN: [id number]  Chief Complaint  Patient presents with  . Follow-up    ER follow up from 09/07/2016.    HPI Patient is in today for follow up. She had a recent episode of altered mental status and was seen in ER. No obvious cause was found and she is improved. Her daughter is here today with her and she reports this episode has helped the family to realize she needs a higher level of care. She did not have any improvement in her incontinence with increase in Detrol. They question if it actually made it worse. Denies CP/palp/SOB/HA/congestion/fevers/GI c/o. Taking meds as prescribed  Past Medical History:  Diagnosis Date  . Chronic low back pain   . Chronic lymphocytic leukemia ([place])   . DDD (degenerative disc disease), lumbar   . Elevated WBC count 09/12/2016  . Glaucoma   . Heart murmur   . History of blood transfusion    due to fracture of femur  . Hx of adenomatous colonic polyps    no polyps in 1/08  . Hyperlipidemia   . Insomnia 06/27/2014  . Mild cognitive impairment 01/07/2016  . Osteoporosis   . Skin cancer   . Squamous cell carcinoma     Past Surgical History:  Procedure Laterality Date  . ABDOMINAL HYSTERECTOMY  2013   at [facility]  . BREAST ENHANCEMENT SURGERY  4696, 2952   with silicone  . CATARACT EXTRACTION     right eye. Also has bilateral eye implants for distance and close up vision.  Marland Kitchen KNEE ARTHROSCOPY  1996   right  . repair of crushed femur  1997  . SKIN CANCER EXCISION Right 12.22.16   Squamous Cell Carcinoma; [place]    Family History  Problem Relation Age of Onset  . Colon cancer Mother     Died from colon caner   . Esophageal cancer Father   . Arthritis Father   . Heart disease Father   . Stroke Maternal Grandmother   . Fibromyalgia Daughter     Social History   Social History  .  Marital status: Single    Spouse name: N/A  . Number of children: 1  . Years of education: N/A   Occupational History  .  Retired   Social History Main Topics  . Smoking status: Never Smoker  . Smokeless tobacco: Never Used  . Alcohol use 4.2 oz/week    7 Glasses of wine per week     Comment: 1 glass wine daily  . Drug use: No  . Sexual activity: Not on file   Other Topics Concern  . 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 acetaminophen (TYLENOL) 500 MG tablet Take 1,000 mg by mouth every morning.    Marland Kitchen aspirin 81 MG tablet Take 81 mg by mouth daily.      . Calcium-Magnesium-Vitamin D (CALCIUM MAGNESIUM PO) Take 1 tablet by mouth daily. Reported on 12/21/2015    .  cefdinir (OMNICEF) 300 MG capsule Take 1 capsule (300 mg total) by mouth 2 (two) times daily. Take for 5 days. 10 capsule 0  . cephALEXin (KEFLEX) 500 MG capsule Take 1 capsule (500 mg total) by mouth 3 (three) times daily. 21 capsule 0  . Cholecalciferol (VITAMIN D) 2000 units CAPS Take 1 capsule by mouth daily.    . Diphenhydramine-APAP, sleep, (TYLENOL PM EXTRA STRENGTH PO) Take 2 tablets by mouth at bedtime.    Marland Kitchen latanoprost (XALATAN) 0.005 % ophthalmic solution Place 1 drop into the right eye daily. 2.5 mL 2  . naproxen sodium (ANAPROX) 220 MG tablet Take 220 mg by mouth daily.    Marland Kitchen senna-docusate (SENOKOT-S) 8.6-50 MG per tablet Take 1 tablet by mouth daily as needed for mild constipation.    . Vitamin D, Ergocalciferol, (DRISDOL) 50000 units CAPS capsule Take 1 capsule (50,000 Units total) by mouth every 7 (seven) days. 12 capsule 0  . tolterodine (DETROL LA) 4 MG 24 hr capsule Take 1 capsule (4 mg total) by mouth daily. (Patient taking  differently: Take 2 mg by mouth daily. ) 30 capsule 2   No facility-administered medications prior to visit.     No Known Allergies  Review of Systems  Constitutional: Negative for fever and malaise/fatigue.  HENT: Negative for congestion.   Eyes: Negative for blurred vision.  Respiratory: Negative for shortness of breath.   Cardiovascular: Negative for chest pain, palpitations and leg swelling.  Gastrointestinal: Negative for abdominal pain, blood in stool and nausea.  Genitourinary: Positive for frequency and urgency. Negative for dysuria and hematuria.  Musculoskeletal: Negative for falls.  Skin: Negative for rash.  Neurological: Negative for dizziness, loss of consciousness and headaches.  Endo/Heme/Allergies: Negative for environmental allergies.  Psychiatric/Behavioral: Positive for memory loss. Negative for depression. The patient is not nervous/anxious.        Objective:    Physical Exam  Constitutional: She is oriented to person, place, and time. She appears well-developed and well-nourished. No distress.  frail  HENT:  Head: Normocephalic and atraumatic.  Nose: Nose normal.  Eyes: Right eye exhibits no discharge. Left eye exhibits no discharge.  Neck: Normal range of motion. Neck supple.  Cardiovascular: Normal rate, regular rhythm and intact distal pulses.   Murmur heard. Pulmonary/Chest: Effort normal. She has no wheezes.  Abdominal: Soft. Bowel sounds are normal. There is no tenderness.  Musculoskeletal: She exhibits no edema.  Neurological: She is alert and oriented to person, place, and time.  Skin: Skin is warm and dry.  Psychiatric: She has a normal mood and affect.  Nursing note and vitals reviewed.   BP 108/62 (BP Location: Left Arm, Patient Position: Sitting, Cuff Size: Normal)   Pulse 90   Temp 97 F (36.1 C) (Oral)   Wt 127 lb (57.6 kg)   SpO2 97% Comment: RA  BMI 21.13 kg/m  Wt Readings from Last 3 Encounters:  09/12/16 127 lb (57.6 kg)    09/07/16 125 lb (56.7 kg)  08/02/16 129 lb (58.5 kg)     Lab Results  Component Value Date   WBC 11.6 (H) 09/12/2016   HGB 12.8 09/12/2016   HCT 37.7 09/12/2016   PLT 274.0 09/12/2016   GLUCOSE 105 (H) 09/12/2016   ALT 20 09/12/2016   AST 22 09/12/2016   NA 138 09/12/2016   K 4.6 09/12/2016   CL 101 09/12/2016   CREATININE 0.86 09/12/2016   BUN 21 09/12/2016   CO2 33 (H) 09/12/2016   TSH 1.17  12/19/2015   HGBA1C 5.9 12/19/2015    Lab Results  Component Value Date   TSH 1.17 12/19/2015   Lab Results  Component Value Date   WBC 11.6 (H) 09/12/2016   HGB 12.8 09/12/2016   HCT 37.7 09/12/2016   MCV 90.8 09/12/2016   PLT 274.0 09/12/2016   Lab Results  Component Value Date   NA 138 09/12/2016   K 4.6 09/12/2016   CHLORIDE 104 06/28/2015   CO2 33 (H) 09/12/2016   GLUCOSE 105 (H) 09/12/2016   BUN 21 09/12/2016   CREATININE 0.86 09/12/2016   BILITOT 0.5 09/12/2016   ALKPHOS 52 09/12/2016   AST 22 09/12/2016   ALT 20 09/12/2016   PROT 6.5 09/12/2016   ALBUMIN 4.0 09/12/2016   CALCIUM 9.0 09/12/2016   ANIONGAP 10 09/07/2016   EGFR 54 (L) 06/28/2015   GFR 65.94 09/12/2016   No results found for: CHOL No results found for: HDL No results found for: LDLCALC No results found for: TRIG No results found for: CHOLHDL Lab Results  Component Value Date   HGBA1C 5.9 12/19/2015       Assessment & Plan:   Problem List Items Addressed This Visit    SENILE OSTEOPOROSIS    Encouraged to get adequate exercise, calcium and vitamin d intake      Urinary incontinence - Primary    Increased Detrol did not help so will drop back to 2 mg daily for now and consider change in med vs urology referral at next visit      Relevant Medications   tolterodine (DETROL LA) 4 MG 24 hr capsule   Other Relevant Orders   CBC (Completed)   POCT Urinalysis Dipstick (Completed)   Urine Culture (Completed)   Essential hypertension    Well controlled, no changes to meds. Encouraged  heart healthy diet such as the DASH diet and exercise as tolerated.       Relevant Orders   Comprehensive metabolic panel (Completed)   Urine Culture (Completed)   Hyperglycemia     minimize simple carbs. Increase exercise as tolerated.       Relevant Orders   Urine Culture (Completed)   Mild cognitive impairment    Had an episode of altered mental status and went to ER but no obvious cause was found. She is improved some but the family has realized she would benefit from a higher level of care. They are likely to proceed.       Relevant Orders   Ambulatory referral to Neurology   Urine Culture (Completed)   Elevated WBC count   Relevant Orders   Urine Culture (Completed)      I am having Ms. Kramme maintain her aspirin, Calcium-Magnesium-Vitamin D (CALCIUM MAGNESIUM PO), naproxen sodium, latanoprost, senna-docusate, acetaminophen, (Diphenhydramine-APAP, sleep, (TYLENOL PM EXTRA STRENGTH PO)), Vitamin D (Ergocalciferol), Vitamin D, cefdinir, cephALEXin, and tolterodine.  Meds ordered this encounter  Medications  . tolterodine (DETROL LA) 4 MG 24 hr capsule    Sig: Take 1 capsule (4 mg total) by mouth daily.    Penni Homans, MD

## 2016-09-18 NOTE — Patient Instructions (Signed)
Hypertension Hypertension, commonly called high blood pressure, is when the force of blood pumping through your arteries is too strong. Your arteries are the blood vessels that carry blood from your heart throughout your body. A blood pressure reading consists of a higher number over a lower number, such as 110/72. The higher number (systolic) is the pressure inside your arteries when your heart pumps. The lower number (diastolic) is the pressure inside your arteries when your heart relaxes. Ideally you want your blood pressure below 120/80. Hypertension forces your heart to work harder to pump blood. Your arteries may become narrow or stiff. Having untreated or uncontrolled hypertension can cause heart attack, stroke, kidney disease, and other problems. What increases the risk? Some risk factors for high blood pressure are controllable. Others are not. Risk factors you cannot control include:  Race. You may be at higher risk if you are African American.  Age. Risk increases with age.  Gender. Men are at higher risk than women before age 45 years. After age 65, women are at higher risk than men. Risk factors you can control include:  Not getting enough exercise or physical activity.  Being overweight.  Getting too much fat, sugar, calories, or salt in your diet.  Drinking too much alcohol. What are the signs or symptoms? Hypertension does not usually cause signs or symptoms. Extremely high blood pressure (hypertensive crisis) may cause headache, anxiety, shortness of breath, and nosebleed. How is this diagnosed? To check if you have hypertension, your health care provider will measure your blood pressure while you are seated, with your arm held at the level of your heart. It should be measured at least twice using the same arm. Certain conditions can cause a difference in blood pressure between your right and left arms. A blood pressure reading that is higher than normal on one occasion does  not mean that you need treatment. If it is not clear whether you have high blood pressure, you may be asked to return on a different day to have your blood pressure checked again. Or, you may be asked to monitor your blood pressure at home for 1 or more weeks. How is this treated? Treating high blood pressure includes making lifestyle changes and possibly taking medicine. Living a healthy lifestyle can help lower high blood pressure. You may need to change some of your habits. Lifestyle changes may include:  Following the DASH diet. This diet is high in fruits, vegetables, and whole grains. It is low in salt, red meat, and added sugars.  Keep your sodium intake below 2,300 mg per day.  Getting at least 30-45 minutes of aerobic exercise at least 4 times per week.  Losing weight if necessary.  Not smoking.  Limiting alcoholic beverages.  Learning ways to reduce stress. Your health care provider may prescribe medicine if lifestyle changes are not enough to get your blood pressure under control, and if one of the following is true:  You are 18-59 years of age and your systolic blood pressure is above 140.  You are 60 years of age or older, and your systolic blood pressure is above 150.  Your diastolic blood pressure is above 90.  You have diabetes, and your systolic blood pressure is over 140 or your diastolic blood pressure is over 90.  You have kidney disease and your blood pressure is above 140/90.  You have heart disease and your blood pressure is above 140/90. Your personal target blood pressure may vary depending on your medical   conditions, your age, and other factors. Follow these instructions at home:  Have your blood pressure rechecked as directed by your health care provider.  Take medicines only as directed by your health care provider. Follow the directions carefully. Blood pressure medicines must be taken as prescribed. The medicine does not work as well when you skip  doses. Skipping doses also puts you at risk for problems.  Do not smoke.  Monitor your blood pressure at home as directed by your health care provider. Contact a health care provider if:  You think you are having a reaction to medicines taken.  You have recurrent headaches or feel dizzy.  You have swelling in your ankles.  You have trouble with your vision. Get help right away if:  You develop a severe headache or confusion.  You have unusual weakness, numbness, or feel faint.  You have severe chest or abdominal pain.  You vomit repeatedly.  You have trouble breathing. This information is not intended to replace advice given to you by your health care provider. Make sure you discuss any questions you have with your health care provider. Document Released: 09/02/2005 Document Revised: 02/08/2016 Document Reviewed: 06/25/2013 Elsevier Interactive Patient Education  2017 Elsevier Inc.  

## 2016-09-18 NOTE — Assessment & Plan Note (Signed)
Had an episode of altered mental status and went to ER but no obvious cause was found. She is improved some but the family has realized she would benefit from a higher level of care. They are likely to proceed.

## 2016-09-19 DIAGNOSIS — M6281 Muscle weakness (generalized): Secondary | ICD-10-CM | POA: Diagnosis not present

## 2016-09-19 DIAGNOSIS — R41841 Cognitive communication deficit: Secondary | ICD-10-CM | POA: Diagnosis not present

## 2016-09-19 DIAGNOSIS — R2689 Other abnormalities of gait and mobility: Secondary | ICD-10-CM | POA: Diagnosis not present

## 2016-09-23 DIAGNOSIS — R41841 Cognitive communication deficit: Secondary | ICD-10-CM | POA: Diagnosis not present

## 2016-09-23 DIAGNOSIS — R2689 Other abnormalities of gait and mobility: Secondary | ICD-10-CM | POA: Diagnosis not present

## 2016-09-23 DIAGNOSIS — M6281 Muscle weakness (generalized): Secondary | ICD-10-CM | POA: Diagnosis not present

## 2016-09-24 DIAGNOSIS — R2689 Other abnormalities of gait and mobility: Secondary | ICD-10-CM | POA: Diagnosis not present

## 2016-09-24 DIAGNOSIS — R41841 Cognitive communication deficit: Secondary | ICD-10-CM | POA: Diagnosis not present

## 2016-09-24 DIAGNOSIS — M6281 Muscle weakness (generalized): Secondary | ICD-10-CM | POA: Diagnosis not present

## 2016-09-26 DIAGNOSIS — M6281 Muscle weakness (generalized): Secondary | ICD-10-CM | POA: Diagnosis not present

## 2016-09-26 DIAGNOSIS — R41841 Cognitive communication deficit: Secondary | ICD-10-CM | POA: Diagnosis not present

## 2016-09-26 DIAGNOSIS — R2689 Other abnormalities of gait and mobility: Secondary | ICD-10-CM | POA: Diagnosis not present

## 2016-09-27 DIAGNOSIS — N3281 Overactive bladder: Secondary | ICD-10-CM | POA: Diagnosis not present

## 2016-09-27 DIAGNOSIS — F0390 Unspecified dementia without behavioral disturbance: Secondary | ICD-10-CM | POA: Diagnosis not present

## 2016-09-27 DIAGNOSIS — R41841 Cognitive communication deficit: Secondary | ICD-10-CM | POA: Diagnosis not present

## 2016-09-27 DIAGNOSIS — R2689 Other abnormalities of gait and mobility: Secondary | ICD-10-CM | POA: Diagnosis not present

## 2016-09-27 DIAGNOSIS — M1991 Primary osteoarthritis, unspecified site: Secondary | ICD-10-CM | POA: Diagnosis not present

## 2016-09-27 DIAGNOSIS — N390 Urinary tract infection, site not specified: Secondary | ICD-10-CM | POA: Diagnosis not present

## 2016-09-27 DIAGNOSIS — M6281 Muscle weakness (generalized): Secondary | ICD-10-CM | POA: Diagnosis not present

## 2016-09-30 DIAGNOSIS — R41841 Cognitive communication deficit: Secondary | ICD-10-CM | POA: Diagnosis not present

## 2016-09-30 DIAGNOSIS — M1990 Unspecified osteoarthritis, unspecified site: Secondary | ICD-10-CM | POA: Diagnosis not present

## 2016-09-30 DIAGNOSIS — R4182 Altered mental status, unspecified: Secondary | ICD-10-CM | POA: Diagnosis not present

## 2016-10-17 ENCOUNTER — Telehealth: Payer: Self-pay | Admitting: Family Medicine

## 2016-10-17 NOTE — Telephone Encounter (Signed)
Patient's daughter, Augustin Coupe, called with questions regarding the patient. She did not want to leave details with me. She would like a call back. Please advise.   Phone: [phone]

## 2016-10-17 NOTE — Telephone Encounter (Signed)
The daughter stated the patient is moving to Assisted living at [facility] Several things needed. #1---They need a prescription that is ok for her to have alcoholic beverages.          (in assisted living they have to have a script per MD and they keep it locked up) the daughter states she does not really drink that much.  #2---Prescription for PT once a week  #3--She is finishing today her last detrol 4 mg and she said you stated a new prescription could be sent to decrease to Detrol 2 mg---as weaning her off.  #4----- What Vitamins that she takes or not is really needed at her age??? They are trying to simplify what the RN at assisted living has to administer.  Next appt. Is on 11/07/2016

## 2016-10-17 NOTE — Telephone Encounter (Signed)
Patient returned your call .Would like a call back  

## 2016-10-17 NOTE — Telephone Encounter (Signed)
Called the daughter left message to call back.

## 2016-10-18 MED ORDER — TOLTERODINE TARTRATE ER 2 MG PO CP24: 2.0000 mg | ORAL_CAPSULE | Freq: Every day | ORAL | 2 refills | Status: DC

## 2016-10-18 NOTE — Telephone Encounter (Signed)
Updated medication list and sent in Detrol 2 mg. Called the daughter informed of vitamins daily to take. Completed prescriptions as requested/instructed. Faxed to [facility] at [phone] Daughter has been informed of all information.

## 2016-10-18 NOTE — Addendum Note (Signed)
Addended by: Sharon Seller B on: 10/18/2016 01:26 PM   Modules accepted: Orders

## 2016-10-18 NOTE — Telephone Encounter (Signed)
Please give order/prescription for #1 and #2 For #3 d/c Detrol 4 mg and start Detrol 2 mg po qhs, disp #30 with 2 rf #4 continue calcium citrate/vitamin D tab (such as Citracal, exact strength not so important keep taking what they are taking) once daily and separate Vitamin D 2000 IU caps, 1 cap po daily are the only daily vitamins to continue

## 2016-10-31 DIAGNOSIS — R4182 Altered mental status, unspecified: Secondary | ICD-10-CM | POA: Diagnosis not present

## 2016-10-31 DIAGNOSIS — E785 Hyperlipidemia, unspecified: Secondary | ICD-10-CM | POA: Diagnosis not present

## 2016-10-31 DIAGNOSIS — I10 Essential (primary) hypertension: Secondary | ICD-10-CM | POA: Diagnosis not present

## 2016-10-31 DIAGNOSIS — E039 Hypothyroidism, unspecified: Secondary | ICD-10-CM | POA: Diagnosis not present

## 2016-10-31 DIAGNOSIS — E538 Deficiency of other specified B group vitamins: Secondary | ICD-10-CM | POA: Diagnosis not present

## 2016-10-31 DIAGNOSIS — E559 Vitamin D deficiency, unspecified: Secondary | ICD-10-CM | POA: Diagnosis not present

## 2016-10-31 DIAGNOSIS — R531 Weakness: Secondary | ICD-10-CM | POA: Diagnosis not present

## 2016-11-04 ENCOUNTER — Telehealth: Payer: Self-pay | Admitting: Family Medicine

## 2016-11-04 DIAGNOSIS — M1991 Primary osteoarthritis, unspecified site: Secondary | ICD-10-CM | POA: Diagnosis not present

## 2016-11-04 DIAGNOSIS — N390 Urinary tract infection, site not specified: Secondary | ICD-10-CM | POA: Diagnosis not present

## 2016-11-04 DIAGNOSIS — K5900 Constipation, unspecified: Secondary | ICD-10-CM | POA: Diagnosis not present

## 2016-11-04 DIAGNOSIS — F0390 Unspecified dementia without behavioral disturbance: Secondary | ICD-10-CM | POA: Diagnosis not present

## 2016-11-04 DIAGNOSIS — E785 Hyperlipidemia, unspecified: Secondary | ICD-10-CM | POA: Diagnosis not present

## 2016-11-04 NOTE — Telephone Encounter (Signed)
Please let them know I completely understand. It is often much easier on families to use the facility doctor but I will of course do whatever is best for them

## 2016-11-04 NOTE — Telephone Encounter (Signed)
Patients daughter came by the office today 11/04/16.  She stated her mom has been moved into assisted living now at [facility].  They have appt. With PCP on 11/07/2016. The patient in just the past few weeks mentally is not doing well.  She will want to discuss with PCP the possibility of transferring her care to the MD at [facility] that is there all the time.  She does not want to leave Dr. Charlett Blake but thinking this may be the smartest thing to do    Just an FYI.

## 2016-11-05 NOTE — Telephone Encounter (Signed)
Pt's daughter Jeani Hawking called back in to speak with Robin.    CB: [phone]

## 2016-11-05 NOTE — Telephone Encounter (Signed)
Called the daughter informed of PCP response.  Had to leave her a detailed message.

## 2016-11-05 NOTE — Telephone Encounter (Signed)
Daughter called this am to inform the patient fell. Daughter states patient does not remember how she feel, confused as to how she fell.  She was not hurt.  She was not totally confused. Daughter states she thinks she does need to see PCP on Thursday to evaluate patient.  Check for cognitive to confirm what is going on.

## 2016-11-07 ENCOUNTER — Ambulatory Visit (INDEPENDENT_AMBULATORY_CARE_PROVIDER_SITE_OTHER): Payer: Medicare Other | Admitting: Family Medicine

## 2016-11-07 ENCOUNTER — Encounter: Payer: Self-pay | Admitting: Family Medicine

## 2016-11-07 VITALS — BP 127/95 | HR 92 | Temp 98.1°F | Wt 122.4 lb

## 2016-11-07 DIAGNOSIS — I10 Essential (primary) hypertension: Secondary | ICD-10-CM

## 2016-11-07 DIAGNOSIS — R413 Other amnesia: Secondary | ICD-10-CM | POA: Diagnosis not present

## 2016-11-07 DIAGNOSIS — R32 Unspecified urinary incontinence: Secondary | ICD-10-CM

## 2016-11-07 DIAGNOSIS — M810 Age-related osteoporosis without current pathological fracture: Secondary | ICD-10-CM | POA: Diagnosis not present

## 2016-11-07 NOTE — Progress Notes (Signed)
Patient ID: Alison Hammond, female   DOB: 05-30-1925, 83 y.o.   MRN: [id number]   Subjective:    Patient ID: Alison Hammond, female    DOB: 1925/02/25, 47 y.o.   MRN: [id number]  Chief Complaint  Patient presents with  . Follow-up    Urinary incontinence. 2 month F/U.    HPI  Patient is in today for a 36-monthfollow up on urinary incontinence. Patient and Daughter feel as if the current medication Detrol LA) is working well for her. Patient who now leaves in assisted living was found on the floor by a Nurse on  11/04/2016. States that the Patient was a little confused for the first two days, then appeared to return to her normal self.  Patient also has a Hx of HTN, scoliosis, dandruff, skin cancer insomnia. There are no additional concerns noted at this time. She feels well today. Feels better today and they report her mental status has returned to normal. She still has confusion and memory concerns and they are interested in a referral to neurology. Denies CP/palp/SOB/HA/congestion/fevers/GI or GU c/o. Taking meds as prescribed  I acted as a sEducation administratorfor SPenni Homans MG[place]by [place]  Past Medical History:  Diagnosis Date  . Chronic low back pain   . [place] lymphocytic [place]   . DDD (degenerative disc disease), lumbar   . Elevated WBC count 09/12/2016  . Glaucoma   . Heart murmur   . History of blood transfusion    due to fracture of femur  . Hx of adenomatous colonic polyps    no polyps in 1/08  . Hyperlipidemia   . Insomnia 06/27/2014  . Mild cognitive impairment 01/07/2016  . Osteoporosis   . Skin cancer   . Squamous cell carcinoma     Past Surgical History:  Procedure Laterality Date  . ABDOMINAL HYSTERECTOMY  2013   at [facility]  . BREAST ENHANCEMENT SURGERY  [id number]  with silicone  . CATARACT EXTRACTION     right eye. Also has bilateral eye implants for distance and close up vision.  .Marland KitchenKNEE ARTHROSCOPY  1996   right  . repair of crushed femur  1997  .  SKIN CANCER EXCISION Right 12.22.16   Squamous [place]cin[place]   Family History  Problem Relation Age of Onset  . Colon cancer Mother     Died from colon caner   . Esophageal cancer Father   . Arthritis Father   . Heart disease Father   . Stroke Maternal Grandmother   . Fibromyalgia Daughter     Social History   Social History  . Marital status: Single    Spouse name: N/A  . Number of children: 1  . Years of education: N/A   Occupational History  .  Retired   Social History Main Topics  . Smoking status: Never Smoker  . Smokeless tobacco: Never Used  . Alcohol use 4.2 oz/week    7 Glasses of wine per week     Comment: 1 glass wine daily  . Drug use: No  . Sexual activity: Not on file   Other Topics Concern  . 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 acetaminophen (TYLENOL) 500 MG tablet Take 1,000 mg by mouth every morning.    . Calcium-Magnesium-Vitamin D (CALCIUM MAGNESIUM PO) Take 1 tablet by mouth daily. Reported on 12/21/2015    . Cholecalciferol (VITAMIN D) 2000 units CAPS Take 1 capsule by mouth daily.    . Diphenhydramine-APAP, sleep, (TYLENOL PM EXTRA STRENGTH PO) Take 2 tablets by mouth at bedtime.    . naproxen sodium (ANAPROX) 220 MG tablet Take 220 mg by mouth daily.    Marland Kitchen senna-docusate (SENOKOT-S) 8.6-50 MG per tablet Take 1 tablet by mouth daily as needed for mild constipation.    Marland Kitchen tolterodine (DETROL LA) 2 MG 24 hr capsule Take 1 capsule (2 mg total) by mouth daily. 30 capsule 2  . aspirin 81 MG tablet Take 81 mg by mouth daily.      Marland Kitchen latanoprost (XALATAN) 0.005 % ophthalmic solution Place 1 drop into the right eye daily. (Patient not taking: Reported on 11/07/2016)  2.5 mL 2  . cefdinir (OMNICEF) 300 MG capsule Take 1 capsule (300 mg total) by mouth 2 (two) times daily. Take for 5 days. (Patient not taking: Reported on 11/07/2016) 10 capsule 0  . cephALEXin (KEFLEX) 500 MG capsule Take 1 capsule (500 mg total) by mouth 3 (three) times daily. (Patient not taking: Reported on 11/07/2016) 21 capsule 0  . Vitamin D, Ergocalciferol, (DRISDOL) 50000 units CAPS capsule Take 1 capsule (50,000 Units total) by mouth every 7 (seven) days. (Patient not taking: Reported on 11/07/2016) 12 capsule 0   No facility-administered medications prior to visit.     No Known Allergies  Review of Systems  Constitutional: Negative for fever and malaise/fatigue.  HENT: Negative for congestion.   Eyes: Negative for blurred vision.  Respiratory: Negative for cough and shortness of breath.   Cardiovascular: Negative for chest pain, palpitations and leg swelling.  Gastrointestinal: Negative for vomiting.  Genitourinary: Positive for frequency and urgency.  Musculoskeletal: Negative for back pain.  Skin: Negative for rash.  Neurological: Negative for loss of consciousness and headaches.       Objective:    Physical Exam  Constitutional: She is oriented to person, place, and time. She appears well-developed and well-nourished. No distress.  HENT:  Head: Normocephalic and atraumatic.  Eyes: Conjunctivae are normal.  Neck: Normal range of motion. No thyromegaly present.  Cardiovascular: Normal rate and regular rhythm.   Pulmonary/Chest: Effort normal and breath sounds normal. She has no wheezes.  Abdominal: Soft. Bowel sounds are normal. There is no tenderness.  Musculoskeletal: She exhibits no edema or deformity.  Neurological: She is alert and oriented to person, place, and time.  Skin: Skin is warm and dry. She is not diaphoretic.  Psychiatric: She has a normal mood and affect.    There were no vitals taken for this visit. Wt Readings from Last 3 Encounters:  09/12/16  127 lb (57.6 kg)  09/07/16 125 lb (56.7 kg)  08/02/16 129 lb (58.5 kg)     Lab Results  Component Value Date   WBC 11.6 (H) 09/12/2016   HGB 12.8 09/12/2016   HCT 37.7 09/12/2016   PLT 274.0 09/12/2016   GLUCOSE 105 (H) 09/12/2016   ALT 20 09/12/2016   AST 22 09/12/2016   NA 138 09/12/2016   K 4.6 09/12/2016   CL 101 09/12/2016   CREATININE 0.86 09/12/2016   BUN 21 09/12/2016   CO2 33 (H) 09/12/2016   TSH 1.17 12/19/2015  HGBA1C 5.9 12/19/2015    Lab Results  Component Value Date   TSH 1.17 12/19/2015   Lab Results  Component Value Date   WBC 11.6 (H) 09/12/2016   HGB 12.8 09/12/2016   HCT 37.7 09/12/2016   MCV 90.8 09/12/2016   PLT 274.0 09/12/2016   Lab Results  Component Value Date   NA 138 09/12/2016   K 4.6 09/12/2016   CHLORIDE 104 06/28/2015   CO2 33 (H) 09/12/2016   GLUCOSE 105 (H) 09/12/2016   BUN 21 09/12/2016   CREATININE 0.86 09/12/2016   BILITOT 0.5 09/12/2016   ALKPHOS 52 09/12/2016   AST 22 09/12/2016   ALT 20 09/12/2016   PROT 6.5 09/12/2016   ALBUMIN 4.0 09/12/2016   CALCIUM 9.0 09/12/2016   ANIONGAP 10 09/07/2016   EGFR 54 (L) 06/28/2015   GFR 65.94 09/12/2016   No results found for: CHOL No results found for: HDL No results found for: LDLCALC No results found for: TRIG No results found for: CHOLHDL Lab Results  Component Value Date   HGBA1C 5.9 12/19/2015       Assessment & Plan:   Problem List Items Addressed This Visit    None      I have discontinued Ms. Therrell's Vitamin D (Ergocalciferol), cefdinir, and cephALEXin. I am also having her maintain her aspirin, Calcium-Magnesium-Vitamin D (CALCIUM MAGNESIUM PO), naproxen sodium, latanoprost, senna-docusate, acetaminophen, (Diphenhydramine-APAP, sleep, (TYLENOL PM EXTRA STRENGTH PO)), Vitamin D, and tolterodine.  No orders of the defined types were placed in this encounter.   CMA served as Education administrator during this visit. History, Physical and Plan performed by medical  provider. Documentation and orders reviewed and attested to.  Shan Levans, CMA

## 2016-11-07 NOTE — Progress Notes (Signed)
Pre visit review using our clinic review tool, if applicable. No additional management support is needed unless otherwise documented below in the visit note. 

## 2016-11-07 NOTE — Patient Instructions (Signed)
Dementia Dementia is the loss of two or more brain functions, such as:  Memory.  Decision making.  Behavior.  Speaking.  Thinking.  Problem solving.  There are many types of dementia. The most common type is called progressive dementia. Progressive dementia gets worse with time and it is irreversible. An example of this type of dementia is Alzheimer disease. What are the causes? This condition may be caused by:  Nerve cell damage in the brain.  Genetic mutations.  Certain medicines.  Multiple small strokes.  An infection, such as chronic meningitis.  A metabolic problem, such as vitamin B12 deficiency or thyroid disease.  Pressure on the brain, such as from a tumor or blood clot.  What are the signs or symptoms? Symptoms of this condition include:  Sudden changes in mood.  Depression.  Problems with balance.  Changes in personality.  Poor short-term memory.  Agitation.  Delusions.  Hallucinations.  Having a hard time: ? Speaking thoughts. ? Finding words. ? Solving problems. ? Doing familiar tasks. ? Understanding familiar ideas.  How is this diagnosed? This condition is diagnosed with an assessment by your health care provider. During this assessment, your health care provider will talk with you and your family, friends, or caregivers about your symptoms. A thorough medical history will be taken, and you will have a physical exam and tests. Tests may include:  Lab tests, such as blood or urine tests.  Imaging tests, such as a CT scan, PET scan, or MRI.  A lumbar puncture. This test involves removing and testing a small amount of the fluid that surrounds the brain and spinal cord.  An electroencephalogram (EEG). In this test, small metal discs are used to measure electrical activity in the brain.  Memory tests, cognitive tests, and neuropsychological tests. These tests evaluate brain function.  How is this treated? Treatment depends on the  cause of the dementia. It may involve taking medicines that may help:  To control the dementia.  To slow down the disease.  To manage symptoms.  In some cases, treating the cause of the dementia can improve symptoms, reverse symptoms, or slow down how quickly the dementia gets worse. Your health care provider can help direct you to support groups, organizations, and other health care providers who can help with decisions about your care. Follow these instructions at home: Medicine  Take over-the-counter and prescription medicines only as told by your health care provider.  Avoid taking medicines that can affect thinking, such as pain or sleeping medicines. Lifestyle   Make healthy lifestyle choices: ? Be physically active as told by your health care provider. ? Do not use any tobacco products, such as cigarettes, chewing tobacco, and e-cigarettes. If you need help quitting, ask your health care provider. ? Eat a healthy diet. ? Practice stress-management techniques when you get stressed. ? Stay social.  Drink enough fluid to keep your urine clear or pale yellow.  Make sure to get quality sleep. These tips can help you to get a good night's rest: ? Avoid napping during the day. ? Keep your sleeping area dark and cool. ? Avoid exercising during the few hours before you go to bed. ? Avoid caffeine products in the evening. General instructions  Work with your health care provider to determine what you need help with and what your safety needs are.  If you were given a bracelet that tracks your location, make sure to wear it.  Keep all follow-up visits as told by your   health care provider. This is important. Contact a health care provider if:  You have any new symptoms.  You have problems with choking or swallowing.  You have any symptoms of a different illness. Get help right away if:  You develop a fever.  You have new or worsening confusion.  You have new or  worsening sleepiness.  You have a hard time staying awake.  You or your family members become concerned for your safety. This information is not intended to replace advice given to you by your health care provider. Make sure you discuss any questions you have with your health care provider. Document Released: 02/26/2001 Document Revised: 01/11/2016 Document Reviewed: 05/31/2015 Elsevier Interactive Patient Education  2017 Elsevier Inc.  

## 2016-11-09 LAB — URINE CULTURE

## 2016-11-17 DIAGNOSIS — R413 Other amnesia: Secondary | ICD-10-CM | POA: Insufficient documentation

## 2016-11-17 NOTE — Assessment & Plan Note (Signed)
Urine culture contaminated. No further work up at this time.

## 2016-11-17 NOTE — Assessment & Plan Note (Signed)
Well controlled, no changes to meds. Encouraged heart healthy diet such as the DASH diet and exercise as tolerated.  °

## 2016-11-17 NOTE — Assessment & Plan Note (Signed)
Encouraged to get adequate exercise, calcium and vitamin d intake 

## 2016-11-17 NOTE — Assessment & Plan Note (Signed)
Is accompanied by family and they agree they would like to explore a further work up. Referred to neurology for consideration

## 2016-11-19 DIAGNOSIS — M545 Low back pain: Secondary | ICD-10-CM | POA: Diagnosis not present

## 2016-11-19 DIAGNOSIS — F0150 Vascular dementia without behavioral disturbance: Secondary | ICD-10-CM | POA: Diagnosis not present

## 2016-11-19 DIAGNOSIS — M170 Bilateral primary osteoarthritis of knee: Secondary | ICD-10-CM | POA: Diagnosis not present

## 2016-11-19 DIAGNOSIS — R2689 Other abnormalities of gait and mobility: Secondary | ICD-10-CM | POA: Diagnosis not present

## 2016-11-19 DIAGNOSIS — R278 Other lack of coordination: Secondary | ICD-10-CM | POA: Diagnosis not present

## 2016-11-20 DIAGNOSIS — M170 Bilateral primary osteoarthritis of knee: Secondary | ICD-10-CM | POA: Diagnosis not present

## 2016-11-20 DIAGNOSIS — M545 Low back pain: Secondary | ICD-10-CM | POA: Diagnosis not present

## 2016-11-20 DIAGNOSIS — F0150 Vascular dementia without behavioral disturbance: Secondary | ICD-10-CM | POA: Diagnosis not present

## 2016-11-20 DIAGNOSIS — R278 Other lack of coordination: Secondary | ICD-10-CM | POA: Diagnosis not present

## 2016-11-20 DIAGNOSIS — R2689 Other abnormalities of gait and mobility: Secondary | ICD-10-CM | POA: Diagnosis not present

## 2016-11-25 DIAGNOSIS — F0150 Vascular dementia without behavioral disturbance: Secondary | ICD-10-CM | POA: Diagnosis not present

## 2016-11-25 DIAGNOSIS — M170 Bilateral primary osteoarthritis of knee: Secondary | ICD-10-CM | POA: Diagnosis not present

## 2016-11-25 DIAGNOSIS — R2689 Other abnormalities of gait and mobility: Secondary | ICD-10-CM | POA: Diagnosis not present

## 2016-11-25 DIAGNOSIS — M545 Low back pain: Secondary | ICD-10-CM | POA: Diagnosis not present

## 2016-11-25 DIAGNOSIS — R278 Other lack of coordination: Secondary | ICD-10-CM | POA: Diagnosis not present

## 2016-11-26 DIAGNOSIS — F0150 Vascular dementia without behavioral disturbance: Secondary | ICD-10-CM | POA: Diagnosis not present

## 2016-11-26 DIAGNOSIS — M545 Low back pain: Secondary | ICD-10-CM | POA: Diagnosis not present

## 2016-11-26 DIAGNOSIS — R2689 Other abnormalities of gait and mobility: Secondary | ICD-10-CM | POA: Diagnosis not present

## 2016-11-26 DIAGNOSIS — R278 Other lack of coordination: Secondary | ICD-10-CM | POA: Diagnosis not present

## 2016-11-26 DIAGNOSIS — M170 Bilateral primary osteoarthritis of knee: Secondary | ICD-10-CM | POA: Diagnosis not present

## 2016-11-29 DIAGNOSIS — F0150 Vascular dementia without behavioral disturbance: Secondary | ICD-10-CM | POA: Diagnosis not present

## 2016-11-29 DIAGNOSIS — M170 Bilateral primary osteoarthritis of knee: Secondary | ICD-10-CM | POA: Diagnosis not present

## 2016-11-29 DIAGNOSIS — M545 Low back pain: Secondary | ICD-10-CM | POA: Diagnosis not present

## 2016-11-29 DIAGNOSIS — R2689 Other abnormalities of gait and mobility: Secondary | ICD-10-CM | POA: Diagnosis not present

## 2016-11-29 DIAGNOSIS — R278 Other lack of coordination: Secondary | ICD-10-CM | POA: Diagnosis not present

## 2016-12-02 DIAGNOSIS — M545 Low back pain: Secondary | ICD-10-CM | POA: Diagnosis not present

## 2016-12-02 DIAGNOSIS — F0150 Vascular dementia without behavioral disturbance: Secondary | ICD-10-CM | POA: Diagnosis not present

## 2016-12-02 DIAGNOSIS — R2689 Other abnormalities of gait and mobility: Secondary | ICD-10-CM | POA: Diagnosis not present

## 2016-12-02 DIAGNOSIS — M170 Bilateral primary osteoarthritis of knee: Secondary | ICD-10-CM | POA: Diagnosis not present

## 2016-12-02 DIAGNOSIS — R278 Other lack of coordination: Secondary | ICD-10-CM | POA: Diagnosis not present

## 2016-12-04 DIAGNOSIS — M170 Bilateral primary osteoarthritis of knee: Secondary | ICD-10-CM | POA: Diagnosis not present

## 2016-12-04 DIAGNOSIS — R278 Other lack of coordination: Secondary | ICD-10-CM | POA: Diagnosis not present

## 2016-12-04 DIAGNOSIS — R2689 Other abnormalities of gait and mobility: Secondary | ICD-10-CM | POA: Diagnosis not present

## 2016-12-04 DIAGNOSIS — F0150 Vascular dementia without behavioral disturbance: Secondary | ICD-10-CM | POA: Diagnosis not present

## 2016-12-04 DIAGNOSIS — M545 Low back pain: Secondary | ICD-10-CM | POA: Diagnosis not present

## 2016-12-06 DIAGNOSIS — F0150 Vascular dementia without behavioral disturbance: Secondary | ICD-10-CM | POA: Diagnosis not present

## 2016-12-06 DIAGNOSIS — R2689 Other abnormalities of gait and mobility: Secondary | ICD-10-CM | POA: Diagnosis not present

## 2016-12-06 DIAGNOSIS — R278 Other lack of coordination: Secondary | ICD-10-CM | POA: Diagnosis not present

## 2016-12-06 DIAGNOSIS — M170 Bilateral primary osteoarthritis of knee: Secondary | ICD-10-CM | POA: Diagnosis not present

## 2016-12-06 DIAGNOSIS — M545 Low back pain: Secondary | ICD-10-CM | POA: Diagnosis not present

## 2016-12-09 DIAGNOSIS — R278 Other lack of coordination: Secondary | ICD-10-CM | POA: Diagnosis not present

## 2016-12-09 DIAGNOSIS — F0150 Vascular dementia without behavioral disturbance: Secondary | ICD-10-CM | POA: Diagnosis not present

## 2016-12-09 DIAGNOSIS — R2689 Other abnormalities of gait and mobility: Secondary | ICD-10-CM | POA: Diagnosis not present

## 2016-12-09 DIAGNOSIS — M170 Bilateral primary osteoarthritis of knee: Secondary | ICD-10-CM | POA: Diagnosis not present

## 2016-12-09 DIAGNOSIS — M545 Low back pain: Secondary | ICD-10-CM | POA: Diagnosis not present

## 2016-12-12 DIAGNOSIS — F0150 Vascular dementia without behavioral disturbance: Secondary | ICD-10-CM | POA: Diagnosis not present

## 2016-12-12 DIAGNOSIS — M545 Low back pain: Secondary | ICD-10-CM | POA: Diagnosis not present

## 2016-12-12 DIAGNOSIS — R2689 Other abnormalities of gait and mobility: Secondary | ICD-10-CM | POA: Diagnosis not present

## 2016-12-12 DIAGNOSIS — M170 Bilateral primary osteoarthritis of knee: Secondary | ICD-10-CM | POA: Diagnosis not present

## 2016-12-12 DIAGNOSIS — R278 Other lack of coordination: Secondary | ICD-10-CM | POA: Diagnosis not present

## 2016-12-13 DIAGNOSIS — R278 Other lack of coordination: Secondary | ICD-10-CM | POA: Diagnosis not present

## 2016-12-13 DIAGNOSIS — M170 Bilateral primary osteoarthritis of knee: Secondary | ICD-10-CM | POA: Diagnosis not present

## 2016-12-13 DIAGNOSIS — M545 Low back pain: Secondary | ICD-10-CM | POA: Diagnosis not present

## 2016-12-13 DIAGNOSIS — R2689 Other abnormalities of gait and mobility: Secondary | ICD-10-CM | POA: Diagnosis not present

## 2016-12-13 DIAGNOSIS — F0150 Vascular dementia without behavioral disturbance: Secondary | ICD-10-CM | POA: Diagnosis not present

## 2016-12-16 DIAGNOSIS — M6281 Muscle weakness (generalized): Secondary | ICD-10-CM | POA: Diagnosis not present

## 2016-12-16 DIAGNOSIS — M170 Bilateral primary osteoarthritis of knee: Secondary | ICD-10-CM | POA: Diagnosis not present

## 2016-12-16 DIAGNOSIS — R2689 Other abnormalities of gait and mobility: Secondary | ICD-10-CM | POA: Diagnosis not present

## 2016-12-16 DIAGNOSIS — R278 Other lack of coordination: Secondary | ICD-10-CM | POA: Diagnosis not present

## 2016-12-16 DIAGNOSIS — M545 Low back pain: Secondary | ICD-10-CM | POA: Diagnosis not present

## 2016-12-16 DIAGNOSIS — F0150 Vascular dementia without behavioral disturbance: Secondary | ICD-10-CM | POA: Diagnosis not present

## 2016-12-18 DIAGNOSIS — R2689 Other abnormalities of gait and mobility: Secondary | ICD-10-CM | POA: Diagnosis not present

## 2016-12-18 DIAGNOSIS — M6281 Muscle weakness (generalized): Secondary | ICD-10-CM | POA: Diagnosis not present

## 2016-12-18 DIAGNOSIS — R278 Other lack of coordination: Secondary | ICD-10-CM | POA: Diagnosis not present

## 2016-12-18 DIAGNOSIS — M170 Bilateral primary osteoarthritis of knee: Secondary | ICD-10-CM | POA: Diagnosis not present

## 2016-12-18 DIAGNOSIS — M545 Low back pain: Secondary | ICD-10-CM | POA: Diagnosis not present

## 2016-12-18 DIAGNOSIS — F0150 Vascular dementia without behavioral disturbance: Secondary | ICD-10-CM | POA: Diagnosis not present

## 2016-12-18 DIAGNOSIS — F0390 Unspecified dementia without behavioral disturbance: Secondary | ICD-10-CM | POA: Diagnosis not present

## 2016-12-18 DIAGNOSIS — R4182 Altered mental status, unspecified: Secondary | ICD-10-CM | POA: Diagnosis not present

## 2016-12-24 DIAGNOSIS — R2689 Other abnormalities of gait and mobility: Secondary | ICD-10-CM | POA: Diagnosis not present

## 2016-12-24 DIAGNOSIS — M6281 Muscle weakness (generalized): Secondary | ICD-10-CM | POA: Diagnosis not present

## 2016-12-24 DIAGNOSIS — F0150 Vascular dementia without behavioral disturbance: Secondary | ICD-10-CM | POA: Diagnosis not present

## 2016-12-24 DIAGNOSIS — M545 Low back pain: Secondary | ICD-10-CM | POA: Diagnosis not present

## 2016-12-24 DIAGNOSIS — R278 Other lack of coordination: Secondary | ICD-10-CM | POA: Diagnosis not present

## 2016-12-24 DIAGNOSIS — M170 Bilateral primary osteoarthritis of knee: Secondary | ICD-10-CM | POA: Diagnosis not present

## 2016-12-27 DIAGNOSIS — M6281 Muscle weakness (generalized): Secondary | ICD-10-CM | POA: Diagnosis not present

## 2016-12-27 DIAGNOSIS — F0150 Vascular dementia without behavioral disturbance: Secondary | ICD-10-CM | POA: Diagnosis not present

## 2016-12-27 DIAGNOSIS — M545 Low back pain: Secondary | ICD-10-CM | POA: Diagnosis not present

## 2016-12-27 DIAGNOSIS — R278 Other lack of coordination: Secondary | ICD-10-CM | POA: Diagnosis not present

## 2016-12-27 DIAGNOSIS — R2689 Other abnormalities of gait and mobility: Secondary | ICD-10-CM | POA: Diagnosis not present

## 2016-12-27 DIAGNOSIS — M170 Bilateral primary osteoarthritis of knee: Secondary | ICD-10-CM | POA: Diagnosis not present

## 2016-12-30 DIAGNOSIS — R278 Other lack of coordination: Secondary | ICD-10-CM | POA: Diagnosis not present

## 2016-12-30 DIAGNOSIS — R2689 Other abnormalities of gait and mobility: Secondary | ICD-10-CM | POA: Diagnosis not present

## 2016-12-30 DIAGNOSIS — M170 Bilateral primary osteoarthritis of knee: Secondary | ICD-10-CM | POA: Diagnosis not present

## 2016-12-30 DIAGNOSIS — F0150 Vascular dementia without behavioral disturbance: Secondary | ICD-10-CM | POA: Diagnosis not present

## 2016-12-30 DIAGNOSIS — M6281 Muscle weakness (generalized): Secondary | ICD-10-CM | POA: Diagnosis not present

## 2016-12-30 DIAGNOSIS — M545 Low back pain: Secondary | ICD-10-CM | POA: Diagnosis not present

## 2016-12-31 DIAGNOSIS — M170 Bilateral primary osteoarthritis of knee: Secondary | ICD-10-CM | POA: Diagnosis not present

## 2016-12-31 DIAGNOSIS — M6281 Muscle weakness (generalized): Secondary | ICD-10-CM | POA: Diagnosis not present

## 2016-12-31 DIAGNOSIS — R2689 Other abnormalities of gait and mobility: Secondary | ICD-10-CM | POA: Diagnosis not present

## 2016-12-31 DIAGNOSIS — F0150 Vascular dementia without behavioral disturbance: Secondary | ICD-10-CM | POA: Diagnosis not present

## 2016-12-31 DIAGNOSIS — R278 Other lack of coordination: Secondary | ICD-10-CM | POA: Diagnosis not present

## 2016-12-31 DIAGNOSIS — M545 Low back pain: Secondary | ICD-10-CM | POA: Diagnosis not present

## 2017-01-02 DIAGNOSIS — R2689 Other abnormalities of gait and mobility: Secondary | ICD-10-CM | POA: Diagnosis not present

## 2017-01-02 DIAGNOSIS — M6281 Muscle weakness (generalized): Secondary | ICD-10-CM | POA: Diagnosis not present

## 2017-01-02 DIAGNOSIS — M545 Low back pain: Secondary | ICD-10-CM | POA: Diagnosis not present

## 2017-01-02 DIAGNOSIS — F0150 Vascular dementia without behavioral disturbance: Secondary | ICD-10-CM | POA: Diagnosis not present

## 2017-01-02 DIAGNOSIS — M170 Bilateral primary osteoarthritis of knee: Secondary | ICD-10-CM | POA: Diagnosis not present

## 2017-01-02 DIAGNOSIS — R278 Other lack of coordination: Secondary | ICD-10-CM | POA: Diagnosis not present

## 2017-01-06 DIAGNOSIS — M545 Low back pain: Secondary | ICD-10-CM | POA: Diagnosis not present

## 2017-01-06 DIAGNOSIS — M170 Bilateral primary osteoarthritis of knee: Secondary | ICD-10-CM | POA: Diagnosis not present

## 2017-01-06 DIAGNOSIS — F0150 Vascular dementia without behavioral disturbance: Secondary | ICD-10-CM | POA: Diagnosis not present

## 2017-01-06 DIAGNOSIS — R278 Other lack of coordination: Secondary | ICD-10-CM | POA: Diagnosis not present

## 2017-01-06 DIAGNOSIS — M6281 Muscle weakness (generalized): Secondary | ICD-10-CM | POA: Diagnosis not present

## 2017-01-06 DIAGNOSIS — R2689 Other abnormalities of gait and mobility: Secondary | ICD-10-CM | POA: Diagnosis not present

## 2017-01-07 DIAGNOSIS — M545 Low back pain: Secondary | ICD-10-CM | POA: Diagnosis not present

## 2017-01-07 DIAGNOSIS — M6281 Muscle weakness (generalized): Secondary | ICD-10-CM | POA: Diagnosis not present

## 2017-01-07 DIAGNOSIS — R2689 Other abnormalities of gait and mobility: Secondary | ICD-10-CM | POA: Diagnosis not present

## 2017-01-07 DIAGNOSIS — F0150 Vascular dementia without behavioral disturbance: Secondary | ICD-10-CM | POA: Diagnosis not present

## 2017-01-07 DIAGNOSIS — M170 Bilateral primary osteoarthritis of knee: Secondary | ICD-10-CM | POA: Diagnosis not present

## 2017-01-07 DIAGNOSIS — R278 Other lack of coordination: Secondary | ICD-10-CM | POA: Diagnosis not present

## 2017-01-08 DIAGNOSIS — M6281 Muscle weakness (generalized): Secondary | ICD-10-CM | POA: Diagnosis not present

## 2017-01-08 DIAGNOSIS — M545 Low back pain: Secondary | ICD-10-CM | POA: Diagnosis not present

## 2017-01-08 DIAGNOSIS — F0150 Vascular dementia without behavioral disturbance: Secondary | ICD-10-CM | POA: Diagnosis not present

## 2017-01-08 DIAGNOSIS — M170 Bilateral primary osteoarthritis of knee: Secondary | ICD-10-CM | POA: Diagnosis not present

## 2017-01-08 DIAGNOSIS — R278 Other lack of coordination: Secondary | ICD-10-CM | POA: Diagnosis not present

## 2017-01-08 DIAGNOSIS — R2689 Other abnormalities of gait and mobility: Secondary | ICD-10-CM | POA: Diagnosis not present

## 2017-01-09 DIAGNOSIS — R278 Other lack of coordination: Secondary | ICD-10-CM | POA: Diagnosis not present

## 2017-01-09 DIAGNOSIS — F0150 Vascular dementia without behavioral disturbance: Secondary | ICD-10-CM | POA: Diagnosis not present

## 2017-01-09 DIAGNOSIS — M6281 Muscle weakness (generalized): Secondary | ICD-10-CM | POA: Diagnosis not present

## 2017-01-09 DIAGNOSIS — R2689 Other abnormalities of gait and mobility: Secondary | ICD-10-CM | POA: Diagnosis not present

## 2017-01-09 DIAGNOSIS — M170 Bilateral primary osteoarthritis of knee: Secondary | ICD-10-CM | POA: Diagnosis not present

## 2017-01-09 DIAGNOSIS — M545 Low back pain: Secondary | ICD-10-CM | POA: Diagnosis not present

## 2017-01-13 ENCOUNTER — Other Ambulatory Visit: Payer: Self-pay | Admitting: Family Medicine

## 2017-01-13 DIAGNOSIS — R2689 Other abnormalities of gait and mobility: Secondary | ICD-10-CM | POA: Diagnosis not present

## 2017-01-13 DIAGNOSIS — M6281 Muscle weakness (generalized): Secondary | ICD-10-CM | POA: Diagnosis not present

## 2017-01-13 DIAGNOSIS — F0150 Vascular dementia without behavioral disturbance: Secondary | ICD-10-CM | POA: Diagnosis not present

## 2017-01-13 DIAGNOSIS — R278 Other lack of coordination: Secondary | ICD-10-CM | POA: Diagnosis not present

## 2017-01-13 DIAGNOSIS — M170 Bilateral primary osteoarthritis of knee: Secondary | ICD-10-CM | POA: Diagnosis not present

## 2017-01-13 DIAGNOSIS — M545 Low back pain: Secondary | ICD-10-CM | POA: Diagnosis not present

## 2017-01-14 DIAGNOSIS — M6281 Muscle weakness (generalized): Secondary | ICD-10-CM | POA: Diagnosis not present

## 2017-01-14 DIAGNOSIS — R278 Other lack of coordination: Secondary | ICD-10-CM | POA: Diagnosis not present

## 2017-01-16 ENCOUNTER — Ambulatory Visit: Payer: Medicare Other | Admitting: Neurology

## 2017-01-17 DIAGNOSIS — M6281 Muscle weakness (generalized): Secondary | ICD-10-CM | POA: Diagnosis not present

## 2017-01-17 DIAGNOSIS — R278 Other lack of coordination: Secondary | ICD-10-CM | POA: Diagnosis not present

## 2017-01-21 DIAGNOSIS — R278 Other lack of coordination: Secondary | ICD-10-CM | POA: Diagnosis not present

## 2017-01-21 DIAGNOSIS — M6281 Muscle weakness (generalized): Secondary | ICD-10-CM | POA: Diagnosis not present

## 2017-01-23 DIAGNOSIS — M6281 Muscle weakness (generalized): Secondary | ICD-10-CM | POA: Diagnosis not present

## 2017-01-23 DIAGNOSIS — R278 Other lack of coordination: Secondary | ICD-10-CM | POA: Diagnosis not present

## 2017-01-27 DIAGNOSIS — R278 Other lack of coordination: Secondary | ICD-10-CM | POA: Diagnosis not present

## 2017-01-27 DIAGNOSIS — M6281 Muscle weakness (generalized): Secondary | ICD-10-CM | POA: Diagnosis not present

## 2017-01-29 DIAGNOSIS — R278 Other lack of coordination: Secondary | ICD-10-CM | POA: Diagnosis not present

## 2017-01-29 DIAGNOSIS — M6281 Muscle weakness (generalized): Secondary | ICD-10-CM | POA: Diagnosis not present

## 2017-01-30 DIAGNOSIS — F4489 Other dissociative and conversion disorders: Secondary | ICD-10-CM | POA: Diagnosis not present

## 2017-01-30 DIAGNOSIS — W19XXXA Unspecified fall, initial encounter: Secondary | ICD-10-CM | POA: Diagnosis not present

## 2017-01-30 DIAGNOSIS — F0390 Unspecified dementia without behavioral disturbance: Secondary | ICD-10-CM | POA: Diagnosis not present

## 2017-01-30 DIAGNOSIS — R278 Other lack of coordination: Secondary | ICD-10-CM | POA: Diagnosis not present

## 2017-01-30 DIAGNOSIS — M6281 Muscle weakness (generalized): Secondary | ICD-10-CM | POA: Diagnosis not present

## 2017-01-30 DIAGNOSIS — R350 Frequency of micturition: Secondary | ICD-10-CM | POA: Diagnosis not present

## 2017-01-31 DIAGNOSIS — M6281 Muscle weakness (generalized): Secondary | ICD-10-CM | POA: Diagnosis not present

## 2017-01-31 DIAGNOSIS — I10 Essential (primary) hypertension: Secondary | ICD-10-CM | POA: Diagnosis not present

## 2017-01-31 DIAGNOSIS — D649 Anemia, unspecified: Secondary | ICD-10-CM | POA: Diagnosis not present

## 2017-01-31 DIAGNOSIS — E039 Hypothyroidism, unspecified: Secondary | ICD-10-CM | POA: Diagnosis not present

## 2017-01-31 DIAGNOSIS — R278 Other lack of coordination: Secondary | ICD-10-CM | POA: Diagnosis not present

## 2017-01-31 DIAGNOSIS — E559 Vitamin D deficiency, unspecified: Secondary | ICD-10-CM | POA: Diagnosis not present

## 2017-02-04 DIAGNOSIS — R4182 Altered mental status, unspecified: Secondary | ICD-10-CM | POA: Diagnosis not present

## 2017-02-05 DIAGNOSIS — M6281 Muscle weakness (generalized): Secondary | ICD-10-CM | POA: Diagnosis not present

## 2017-02-05 DIAGNOSIS — R278 Other lack of coordination: Secondary | ICD-10-CM | POA: Diagnosis not present

## 2017-02-06 DIAGNOSIS — M6281 Muscle weakness (generalized): Secondary | ICD-10-CM | POA: Diagnosis not present

## 2017-02-06 DIAGNOSIS — S0990XA Unspecified injury of head, initial encounter: Secondary | ICD-10-CM | POA: Diagnosis not present

## 2017-02-06 DIAGNOSIS — R278 Other lack of coordination: Secondary | ICD-10-CM | POA: Diagnosis not present

## 2017-02-06 DIAGNOSIS — R410 Disorientation, unspecified: Secondary | ICD-10-CM | POA: Diagnosis not present

## 2017-02-07 DIAGNOSIS — M6281 Muscle weakness (generalized): Secondary | ICD-10-CM | POA: Diagnosis not present

## 2017-02-07 DIAGNOSIS — R278 Other lack of coordination: Secondary | ICD-10-CM | POA: Diagnosis not present

## 2017-02-11 DIAGNOSIS — Z0189 Encounter for other specified special examinations: Secondary | ICD-10-CM | POA: Diagnosis not present

## 2017-02-11 DIAGNOSIS — R278 Other lack of coordination: Secondary | ICD-10-CM | POA: Diagnosis not present

## 2017-02-11 DIAGNOSIS — M6281 Muscle weakness (generalized): Secondary | ICD-10-CM | POA: Diagnosis not present

## 2017-02-12 DIAGNOSIS — F0390 Unspecified dementia without behavioral disturbance: Secondary | ICD-10-CM | POA: Diagnosis not present

## 2017-02-13 DIAGNOSIS — M6281 Muscle weakness (generalized): Secondary | ICD-10-CM | POA: Diagnosis not present

## 2017-02-13 DIAGNOSIS — R278 Other lack of coordination: Secondary | ICD-10-CM | POA: Diagnosis not present

## 2017-02-14 ENCOUNTER — Other Ambulatory Visit: Payer: Self-pay | Admitting: Family Medicine

## 2017-02-14 DIAGNOSIS — R278 Other lack of coordination: Secondary | ICD-10-CM | POA: Diagnosis not present

## 2017-02-14 DIAGNOSIS — M6281 Muscle weakness (generalized): Secondary | ICD-10-CM | POA: Diagnosis not present

## 2017-02-18 DIAGNOSIS — M6281 Muscle weakness (generalized): Secondary | ICD-10-CM | POA: Diagnosis not present

## 2017-02-18 DIAGNOSIS — R278 Other lack of coordination: Secondary | ICD-10-CM | POA: Diagnosis not present

## 2017-03-20 ENCOUNTER — Other Ambulatory Visit: Payer: Self-pay | Admitting: Family Medicine

## 2017-04-08 DIAGNOSIS — L989 Disorder of the skin and subcutaneous tissue, unspecified: Secondary | ICD-10-CM | POA: Diagnosis not present

## 2017-04-08 DIAGNOSIS — L570 Actinic keratosis: Secondary | ICD-10-CM | POA: Diagnosis not present

## 2017-04-08 DIAGNOSIS — L814 Other melanin hyperpigmentation: Secondary | ICD-10-CM | POA: Diagnosis not present

## 2017-04-08 DIAGNOSIS — L821 Other seborrheic keratosis: Secondary | ICD-10-CM | POA: Diagnosis not present

## 2017-04-08 DIAGNOSIS — L820 Inflamed seborrheic keratosis: Secondary | ICD-10-CM | POA: Diagnosis not present

## 2017-04-16 ENCOUNTER — Other Ambulatory Visit: Payer: Self-pay | Admitting: Family Medicine

## 2017-06-04 DIAGNOSIS — L570 Actinic keratosis: Secondary | ICD-10-CM | POA: Diagnosis not present

## 2017-06-04 DIAGNOSIS — L821 Other seborrheic keratosis: Secondary | ICD-10-CM | POA: Diagnosis not present

## 2017-06-04 DIAGNOSIS — L814 Other melanin hyperpigmentation: Secondary | ICD-10-CM | POA: Diagnosis not present

## 2017-06-19 DIAGNOSIS — M6281 Muscle weakness (generalized): Secondary | ICD-10-CM | POA: Diagnosis not present

## 2017-06-19 DIAGNOSIS — M25552 Pain in left hip: Secondary | ICD-10-CM | POA: Diagnosis not present

## 2017-06-19 DIAGNOSIS — R278 Other lack of coordination: Secondary | ICD-10-CM | POA: Diagnosis not present

## 2017-06-23 DIAGNOSIS — R278 Other lack of coordination: Secondary | ICD-10-CM | POA: Diagnosis not present

## 2017-06-23 DIAGNOSIS — M25552 Pain in left hip: Secondary | ICD-10-CM | POA: Diagnosis not present

## 2017-06-23 DIAGNOSIS — M6281 Muscle weakness (generalized): Secondary | ICD-10-CM | POA: Diagnosis not present

## 2017-06-24 DIAGNOSIS — M25552 Pain in left hip: Secondary | ICD-10-CM | POA: Diagnosis not present

## 2017-06-24 DIAGNOSIS — R278 Other lack of coordination: Secondary | ICD-10-CM | POA: Diagnosis not present

## 2017-06-24 DIAGNOSIS — M6281 Muscle weakness (generalized): Secondary | ICD-10-CM | POA: Diagnosis not present

## 2017-06-27 DIAGNOSIS — R278 Other lack of coordination: Secondary | ICD-10-CM | POA: Diagnosis not present

## 2017-06-27 DIAGNOSIS — M6281 Muscle weakness (generalized): Secondary | ICD-10-CM | POA: Diagnosis not present

## 2017-06-27 DIAGNOSIS — M25552 Pain in left hip: Secondary | ICD-10-CM | POA: Diagnosis not present

## 2017-06-30 DIAGNOSIS — M6281 Muscle weakness (generalized): Secondary | ICD-10-CM | POA: Diagnosis not present

## 2017-06-30 DIAGNOSIS — M25552 Pain in left hip: Secondary | ICD-10-CM | POA: Diagnosis not present

## 2017-06-30 DIAGNOSIS — R278 Other lack of coordination: Secondary | ICD-10-CM | POA: Diagnosis not present

## 2017-07-04 DIAGNOSIS — R278 Other lack of coordination: Secondary | ICD-10-CM | POA: Diagnosis not present

## 2017-07-04 DIAGNOSIS — M25552 Pain in left hip: Secondary | ICD-10-CM | POA: Diagnosis not present

## 2017-07-04 DIAGNOSIS — M6281 Muscle weakness (generalized): Secondary | ICD-10-CM | POA: Diagnosis not present

## 2017-07-07 DIAGNOSIS — R278 Other lack of coordination: Secondary | ICD-10-CM | POA: Diagnosis not present

## 2017-07-07 DIAGNOSIS — M6281 Muscle weakness (generalized): Secondary | ICD-10-CM | POA: Diagnosis not present

## 2017-07-07 DIAGNOSIS — Z23 Encounter for immunization: Secondary | ICD-10-CM | POA: Diagnosis not present

## 2017-07-07 DIAGNOSIS — M25552 Pain in left hip: Secondary | ICD-10-CM | POA: Diagnosis not present

## 2017-07-09 DIAGNOSIS — R278 Other lack of coordination: Secondary | ICD-10-CM | POA: Diagnosis not present

## 2017-07-09 DIAGNOSIS — M25552 Pain in left hip: Secondary | ICD-10-CM | POA: Diagnosis not present

## 2017-07-09 DIAGNOSIS — M6281 Muscle weakness (generalized): Secondary | ICD-10-CM | POA: Diagnosis not present

## 2017-07-11 DIAGNOSIS — M6281 Muscle weakness (generalized): Secondary | ICD-10-CM | POA: Diagnosis not present

## 2017-07-11 DIAGNOSIS — M25552 Pain in left hip: Secondary | ICD-10-CM | POA: Diagnosis not present

## 2017-07-11 DIAGNOSIS — R278 Other lack of coordination: Secondary | ICD-10-CM | POA: Diagnosis not present

## 2017-07-14 DIAGNOSIS — M6281 Muscle weakness (generalized): Secondary | ICD-10-CM | POA: Diagnosis not present

## 2017-07-14 DIAGNOSIS — M25552 Pain in left hip: Secondary | ICD-10-CM | POA: Diagnosis not present

## 2017-07-14 DIAGNOSIS — R278 Other lack of coordination: Secondary | ICD-10-CM | POA: Diagnosis not present

## 2017-07-15 DIAGNOSIS — Z1231 Encounter for screening mammogram for malignant neoplasm of breast: Secondary | ICD-10-CM | POA: Diagnosis not present

## 2017-07-17 DIAGNOSIS — R278 Other lack of coordination: Secondary | ICD-10-CM | POA: Diagnosis not present

## 2017-07-17 DIAGNOSIS — M25552 Pain in left hip: Secondary | ICD-10-CM | POA: Diagnosis not present

## 2017-07-17 DIAGNOSIS — M6281 Muscle weakness (generalized): Secondary | ICD-10-CM | POA: Diagnosis not present

## 2017-07-18 DIAGNOSIS — M6281 Muscle weakness (generalized): Secondary | ICD-10-CM | POA: Diagnosis not present

## 2017-07-18 DIAGNOSIS — M25552 Pain in left hip: Secondary | ICD-10-CM | POA: Diagnosis not present

## 2017-07-18 DIAGNOSIS — R278 Other lack of coordination: Secondary | ICD-10-CM | POA: Diagnosis not present

## 2017-07-21 DIAGNOSIS — M6281 Muscle weakness (generalized): Secondary | ICD-10-CM | POA: Diagnosis not present

## 2017-07-21 DIAGNOSIS — M25552 Pain in left hip: Secondary | ICD-10-CM | POA: Diagnosis not present

## 2017-07-21 DIAGNOSIS — R278 Other lack of coordination: Secondary | ICD-10-CM | POA: Diagnosis not present

## 2017-07-23 DIAGNOSIS — M6281 Muscle weakness (generalized): Secondary | ICD-10-CM | POA: Diagnosis not present

## 2017-07-23 DIAGNOSIS — M25552 Pain in left hip: Secondary | ICD-10-CM | POA: Diagnosis not present

## 2017-07-23 DIAGNOSIS — R278 Other lack of coordination: Secondary | ICD-10-CM | POA: Diagnosis not present

## 2017-07-24 DIAGNOSIS — R278 Other lack of coordination: Secondary | ICD-10-CM | POA: Diagnosis not present

## 2017-07-24 DIAGNOSIS — M25552 Pain in left hip: Secondary | ICD-10-CM | POA: Diagnosis not present

## 2017-07-24 DIAGNOSIS — M6281 Muscle weakness (generalized): Secondary | ICD-10-CM | POA: Diagnosis not present

## 2017-09-12 DIAGNOSIS — D1801 Hemangioma of skin and subcutaneous tissue: Secondary | ICD-10-CM | POA: Diagnosis not present

## 2017-09-12 DIAGNOSIS — L570 Actinic keratosis: Secondary | ICD-10-CM | POA: Diagnosis not present

## 2017-09-12 DIAGNOSIS — L814 Other melanin hyperpigmentation: Secondary | ICD-10-CM | POA: Diagnosis not present

## 2017-10-15 DIAGNOSIS — F0390 Unspecified dementia without behavioral disturbance: Secondary | ICD-10-CM | POA: Diagnosis not present

## 2017-10-15 DIAGNOSIS — E559 Vitamin D deficiency, unspecified: Secondary | ICD-10-CM | POA: Diagnosis not present

## 2017-10-15 DIAGNOSIS — M1991 Primary osteoarthritis, unspecified site: Secondary | ICD-10-CM | POA: Diagnosis not present

## 2017-10-15 DIAGNOSIS — N3281 Overactive bladder: Secondary | ICD-10-CM | POA: Diagnosis not present

## 2018-02-12 DIAGNOSIS — F0390 Unspecified dementia without behavioral disturbance: Secondary | ICD-10-CM | POA: Diagnosis not present

## 2018-02-12 DIAGNOSIS — R05 Cough: Secondary | ICD-10-CM | POA: Diagnosis not present

## 2018-02-12 DIAGNOSIS — N3281 Overactive bladder: Secondary | ICD-10-CM | POA: Diagnosis not present

## 2018-02-12 DIAGNOSIS — E559 Vitamin D deficiency, unspecified: Secondary | ICD-10-CM | POA: Diagnosis not present

## 2018-03-25 DIAGNOSIS — R278 Other lack of coordination: Secondary | ICD-10-CM | POA: Diagnosis not present

## 2018-03-25 DIAGNOSIS — M6281 Muscle weakness (generalized): Secondary | ICD-10-CM | POA: Diagnosis not present

## 2018-03-25 DIAGNOSIS — R2689 Other abnormalities of gait and mobility: Secondary | ICD-10-CM | POA: Diagnosis not present

## 2018-03-27 DIAGNOSIS — R2689 Other abnormalities of gait and mobility: Secondary | ICD-10-CM | POA: Diagnosis not present

## 2018-03-27 DIAGNOSIS — R278 Other lack of coordination: Secondary | ICD-10-CM | POA: Diagnosis not present

## 2018-03-27 DIAGNOSIS — M6281 Muscle weakness (generalized): Secondary | ICD-10-CM | POA: Diagnosis not present

## 2018-03-31 DIAGNOSIS — R278 Other lack of coordination: Secondary | ICD-10-CM | POA: Diagnosis not present

## 2018-03-31 DIAGNOSIS — M6281 Muscle weakness (generalized): Secondary | ICD-10-CM | POA: Diagnosis not present

## 2018-03-31 DIAGNOSIS — R2689 Other abnormalities of gait and mobility: Secondary | ICD-10-CM | POA: Diagnosis not present

## 2018-04-02 DIAGNOSIS — M6281 Muscle weakness (generalized): Secondary | ICD-10-CM | POA: Diagnosis not present

## 2018-04-02 DIAGNOSIS — R278 Other lack of coordination: Secondary | ICD-10-CM | POA: Diagnosis not present

## 2018-04-02 DIAGNOSIS — R2689 Other abnormalities of gait and mobility: Secondary | ICD-10-CM | POA: Diagnosis not present

## 2018-04-06 DIAGNOSIS — R2689 Other abnormalities of gait and mobility: Secondary | ICD-10-CM | POA: Diagnosis not present

## 2018-04-06 DIAGNOSIS — M6281 Muscle weakness (generalized): Secondary | ICD-10-CM | POA: Diagnosis not present

## 2018-04-06 DIAGNOSIS — R278 Other lack of coordination: Secondary | ICD-10-CM | POA: Diagnosis not present

## 2018-04-07 DIAGNOSIS — R278 Other lack of coordination: Secondary | ICD-10-CM | POA: Diagnosis not present

## 2018-04-07 DIAGNOSIS — R2689 Other abnormalities of gait and mobility: Secondary | ICD-10-CM | POA: Diagnosis not present

## 2018-04-07 DIAGNOSIS — M6281 Muscle weakness (generalized): Secondary | ICD-10-CM | POA: Diagnosis not present

## 2018-04-10 DIAGNOSIS — M6281 Muscle weakness (generalized): Secondary | ICD-10-CM | POA: Diagnosis not present

## 2018-04-10 DIAGNOSIS — R278 Other lack of coordination: Secondary | ICD-10-CM | POA: Diagnosis not present

## 2018-04-10 DIAGNOSIS — R2689 Other abnormalities of gait and mobility: Secondary | ICD-10-CM | POA: Diagnosis not present

## 2018-04-13 DIAGNOSIS — M6281 Muscle weakness (generalized): Secondary | ICD-10-CM | POA: Diagnosis not present

## 2018-04-13 DIAGNOSIS — R2689 Other abnormalities of gait and mobility: Secondary | ICD-10-CM | POA: Diagnosis not present

## 2018-04-13 DIAGNOSIS — R278 Other lack of coordination: Secondary | ICD-10-CM | POA: Diagnosis not present

## 2018-04-16 DIAGNOSIS — M6281 Muscle weakness (generalized): Secondary | ICD-10-CM | POA: Diagnosis not present

## 2018-04-16 DIAGNOSIS — R2689 Other abnormalities of gait and mobility: Secondary | ICD-10-CM | POA: Diagnosis not present

## 2018-04-16 DIAGNOSIS — R278 Other lack of coordination: Secondary | ICD-10-CM | POA: Diagnosis not present

## 2018-04-17 DIAGNOSIS — R278 Other lack of coordination: Secondary | ICD-10-CM | POA: Diagnosis not present

## 2018-04-17 DIAGNOSIS — R2689 Other abnormalities of gait and mobility: Secondary | ICD-10-CM | POA: Diagnosis not present

## 2018-04-17 DIAGNOSIS — M6281 Muscle weakness (generalized): Secondary | ICD-10-CM | POA: Diagnosis not present

## 2018-04-21 DIAGNOSIS — R278 Other lack of coordination: Secondary | ICD-10-CM | POA: Diagnosis not present

## 2018-04-21 DIAGNOSIS — M6281 Muscle weakness (generalized): Secondary | ICD-10-CM | POA: Diagnosis not present

## 2018-04-21 DIAGNOSIS — R2689 Other abnormalities of gait and mobility: Secondary | ICD-10-CM | POA: Diagnosis not present

## 2018-04-23 DIAGNOSIS — M6281 Muscle weakness (generalized): Secondary | ICD-10-CM | POA: Diagnosis not present

## 2018-04-23 DIAGNOSIS — R2689 Other abnormalities of gait and mobility: Secondary | ICD-10-CM | POA: Diagnosis not present

## 2018-04-23 DIAGNOSIS — R278 Other lack of coordination: Secondary | ICD-10-CM | POA: Diagnosis not present

## 2018-04-27 DIAGNOSIS — M6281 Muscle weakness (generalized): Secondary | ICD-10-CM | POA: Diagnosis not present

## 2018-04-27 DIAGNOSIS — R278 Other lack of coordination: Secondary | ICD-10-CM | POA: Diagnosis not present

## 2018-04-27 DIAGNOSIS — R2689 Other abnormalities of gait and mobility: Secondary | ICD-10-CM | POA: Diagnosis not present

## 2018-04-28 DIAGNOSIS — R2689 Other abnormalities of gait and mobility: Secondary | ICD-10-CM | POA: Diagnosis not present

## 2018-04-28 DIAGNOSIS — R278 Other lack of coordination: Secondary | ICD-10-CM | POA: Diagnosis not present

## 2018-04-28 DIAGNOSIS — M6281 Muscle weakness (generalized): Secondary | ICD-10-CM | POA: Diagnosis not present

## 2018-04-30 DIAGNOSIS — R2689 Other abnormalities of gait and mobility: Secondary | ICD-10-CM | POA: Diagnosis not present

## 2018-04-30 DIAGNOSIS — M6281 Muscle weakness (generalized): Secondary | ICD-10-CM | POA: Diagnosis not present

## 2018-04-30 DIAGNOSIS — R278 Other lack of coordination: Secondary | ICD-10-CM | POA: Diagnosis not present

## 2018-05-04 DIAGNOSIS — R278 Other lack of coordination: Secondary | ICD-10-CM | POA: Diagnosis not present

## 2018-05-04 DIAGNOSIS — R2689 Other abnormalities of gait and mobility: Secondary | ICD-10-CM | POA: Diagnosis not present

## 2018-05-04 DIAGNOSIS — M6281 Muscle weakness (generalized): Secondary | ICD-10-CM | POA: Diagnosis not present

## 2018-09-29 ENCOUNTER — Telehealth: Payer: Self-pay | Admitting: Family Medicine

## 2018-09-29 NOTE — Telephone Encounter (Signed)
Jeani Hawking called office regarding AWV call received for her mom Alison Hammond. She stated that Alison Hammond is in a nursing home and will not be able to do the wellness visit. Jeani Hawking also asked if Alison Hammond name can be removed from the call list. [facility]

## 2020-02-15 DEATH — deceased
